# Patient Record
Sex: Male | Born: 1970 | Race: White | Hispanic: No | Marital: Married | Smoking: Never smoker
Health system: Southern US, Community
[De-identification: ages and names within clinical notes are randomized; demographics above are authoritative.]

## PROBLEM LIST (undated history)

## (undated) DIAGNOSIS — F329 Major depressive disorder, single episode, unspecified: Secondary | ICD-10-CM

## (undated) DIAGNOSIS — N200 Calculus of kidney: Secondary | ICD-10-CM

## (undated) DIAGNOSIS — F32A Depression, unspecified: Secondary | ICD-10-CM

## (undated) DIAGNOSIS — F319 Bipolar disorder, unspecified: Secondary | ICD-10-CM

## (undated) DIAGNOSIS — F419 Anxiety disorder, unspecified: Secondary | ICD-10-CM

## (undated) HISTORY — DX: Depression, unspecified: F32.A

## (undated) HISTORY — DX: Calculus of kidney: N20.0

## (undated) HISTORY — DX: Major depressive disorder, single episode, unspecified: F32.9

## (undated) HISTORY — DX: Anxiety disorder, unspecified: F41.9

## (undated) HISTORY — PX: OTHER SURGICAL HISTORY: SHX169

## (undated) HISTORY — DX: Bipolar disorder, unspecified: F31.9

---

## 2007-04-20 ENCOUNTER — Ambulatory Visit: Payer: Self-pay | Admitting: Internal Medicine

## 2011-09-03 ENCOUNTER — Ambulatory Visit: Payer: Self-pay | Admitting: Internal Medicine

## 2011-09-03 LAB — CBC CANCER CENTER
Basophil #: 0 x10 3/mm (ref 0.0–0.1)
Basophil %: 0.2 %
Eosinophil #: 0.1 x10 3/mm (ref 0.0–0.7)
Eosinophil %: 1.6 %
Eosinophil: 2 %
HCT: 45.2 % (ref 40.0–52.0)
HGB: 15.6 g/dL (ref 13.0–18.0)
Lymphocyte %: 32.4 %
Lymphocytes: 32 %
MCV: 93 fL (ref 80–100)
Metamyelocyte: 1 %
Monocyte %: 7.2 %
Monocytes: 4 %
Neutrophil #: 3.8 x10 3/mm (ref 1.4–6.5)
Neutrophil %: 58.6 %
Platelet: 185 x10 3/mm (ref 150–440)
RBC: 4.89 10*6/uL (ref 4.40–5.90)
RDW: 12.6 % (ref 11.5–14.5)
Segmented Neutrophils: 60 %
WBC: 6.5 x10 3/mm (ref 3.8–10.6)

## 2011-09-03 LAB — HEPATIC FUNCTION PANEL A (ARMC)
Albumin: 4.3 g/dL (ref 3.4–5.0)
Bilirubin, Direct: 0.2 mg/dL (ref 0.00–0.20)
Bilirubin,Total: 1.2 mg/dL — ABNORMAL HIGH (ref 0.2–1.0)
SGPT (ALT): 55 U/L (ref 12–78)
Total Protein: 7.5 g/dL (ref 6.4–8.2)

## 2011-09-03 LAB — CREATININE, SERUM: EGFR (Non-African Amer.): >60

## 2011-09-03 LAB — LACTATE DEHYDROGENASE: LDH: 190 U/L (ref 81–234)

## 2011-09-06 LAB — PROT IMMUNOELECTROPHORES(ARMC)

## 2011-09-26 ENCOUNTER — Ambulatory Visit: Payer: Self-pay | Admitting: Internal Medicine

## 2011-09-30 ENCOUNTER — Other Ambulatory Visit: Payer: Self-pay | Admitting: Gastroenterology

## 2012-05-08 ENCOUNTER — Ambulatory Visit: Payer: Self-pay | Admitting: Internal Medicine

## 2014-09-05 ENCOUNTER — Ambulatory Visit (INDEPENDENT_AMBULATORY_CARE_PROVIDER_SITE_OTHER): Payer: BC Managed Care – PPO | Admitting: Psychiatry

## 2014-09-05 ENCOUNTER — Encounter: Payer: Self-pay | Admitting: Psychiatry

## 2014-09-05 DIAGNOSIS — F3160 Bipolar disorder, current episode mixed, unspecified: Secondary | ICD-10-CM | POA: Diagnosis not present

## 2014-09-05 MED ORDER — LAMOTRIGINE 200 MG PO TABS: 200.0000 mg | ORAL_TABLET | Freq: Every morning | ORAL | Status: DC

## 2014-09-05 NOTE — Progress Notes (Signed)
BH MD/PA/NP OP Progress Note  09/05/2014 2:42 PM Jeffrey Hopkins  MRN:  [id number]  Subjective:   Patient is a 44 year old male who presented for the follow-up appointment. He reported that he has been feeling well and has been enjoying his summer break. He went with his daughters to [place] and [place] and enjoyed his time. He is now working in his house and trying to fix the bathrooms. Patient reported that he has been stable on the current dose of lamotrigine at 200 mg. He is not taking trazodone as he has been sleeping well. He reported that the Klonopin is helping him and he has been taking it on a when necessary basis. Patient also takes Claritin and vitamin B12 on a regular basis. He reported that he does not have any allergies at this time. He currently denied having any mood swings anger anxiety or paranoia. He denied having any perceptual disturbances. He appeared pleasant and cooperative during the interview.   Chief Complaint:  Chief Complaint    Follow-up; Depression     Visit Diagnosis:     ICD-9-CM ICD-10-CM   1. Bipolar affective disorder, current episode mixed, current episode severity unspecified 296.60 F31.60     Past Medical History: History reviewed. No pertinent past medical history. History reviewed. No pertinent past surgical history. Family History:  Family History  Problem Relation Age of Onset  . Anxiety disorder Father   . Bipolar disorder Maternal Uncle    Social History:  Social History   Social History  . Marital Status: Married    Spouse Name: N/A  . Number of Children: N/A  . Years of Education: N/A   Social History Main Topics  . Smoking status: Never Smoker   . Smokeless tobacco: None  . Alcohol Use: 0.6 oz/week    1 Cans of beer per week     Comment: 2-3 times per week   . Drug Use: No  . Sexual Activity: Yes   Other Topics Concern  . None   Social History Narrative  . 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  General Appearance: Casual  Eye Contact:  Fair  Speech:  Clear and Coherent and Normal Rate  Volume:  Normal  Mood:  Euthymic  Affect:  Congruent  Thought Process:  Coherent  Orientation:  Full (Time, Place, and Person)  Thought Content:  WDL  Suicidal Thoughts:  No  Homicidal Thoughts:  No  Memory:  Immediate;   Fair  Judgement:  Fair  Insight:  Fair  Psychomotor Activity:  Normal  Concentration:  Fair  Recall:  [employer] of Knowledge: Fair  Language: Fair  Akathisia:  No  Handed:  Right  AIMS (if indicated):    Assets:  Communication Skills Desire for Improvement Physical Health Social Support Talents/Skills  ADL's:  Intact  Cognition: WNL  Sleep:     Is the patient at risk to self?  No. Has the patient been a risk to self in the past 6 months?  No. Has the patient been a risk to self within the distant past?  No. Is the patient a risk  to others?  No. Has the patient been a risk to others in the past 6 months?  No. Has the patient been a risk to others within the distant past?  No.  Current Medications: Current Outpatient Prescriptions  Medication Sig Dispense Refill  . clonazePAM (KLONOPIN) 0.5 MG tablet Take 0.5 mg by mouth at bedtime.  0  . cyanocobalamin (V-R VITAMIN B-12) 500 MCG tablet Take 1,000 mcg by mouth.    . lamoTRIgine (LAMICTAL) 200 MG tablet Take 200 mg by mouth every morning.  0   No current facility-administered medications for this visit.    Medical Decision Making:  Review of Psycho-Social Stressors (1) and Review of Last Therapy Session (1)  Treatment Plan Summary:Medication management  Patient has enough supply of  of  Klonopin. I will refill his lamotrigine. He will follow up in 3 months or earlier.   More than 50% of the time spent in psychoeducation, counseling and coordination of care.    This note was generated in part or whole with voice recognition software. Voice regonition is usually quite accurate but there are transcription errors that can and very often do occur. I apologize for any typographical errors that were not detected and corrected.    Brandy Hale 09/05/2014, 2:42 PM

## 2014-12-03 ENCOUNTER — Encounter: Payer: Self-pay | Admitting: Psychiatry

## 2014-12-03 ENCOUNTER — Ambulatory Visit (INDEPENDENT_AMBULATORY_CARE_PROVIDER_SITE_OTHER): Payer: BC Managed Care – PPO | Admitting: Psychiatry

## 2014-12-03 VITALS — BP 124/80 | HR 75 | Temp 97.6°F | Ht 74.0 in | Wt 214.6 lb

## 2014-12-03 DIAGNOSIS — F3160 Bipolar disorder, current episode mixed, unspecified: Secondary | ICD-10-CM

## 2014-12-03 DIAGNOSIS — J329 Chronic sinusitis, unspecified: Secondary | ICD-10-CM | POA: Insufficient documentation

## 2014-12-03 DIAGNOSIS — E538 Deficiency of other specified B group vitamins: Secondary | ICD-10-CM | POA: Insufficient documentation

## 2014-12-03 DIAGNOSIS — R768 Other specified abnormal immunological findings in serum: Secondary | ICD-10-CM | POA: Insufficient documentation

## 2014-12-03 DIAGNOSIS — F329 Major depressive disorder, single episode, unspecified: Secondary | ICD-10-CM | POA: Insufficient documentation

## 2014-12-03 DIAGNOSIS — F32A Depression, unspecified: Secondary | ICD-10-CM | POA: Insufficient documentation

## 2014-12-03 MED ORDER — CLONAZEPAM 0.5 MG PO TABS: 0.5000 mg | ORAL_TABLET | Freq: Every day | ORAL | Status: DC

## 2014-12-03 MED ORDER — LAMOTRIGINE 200 MG PO TABS: 200.0000 mg | ORAL_TABLET | Freq: Every morning | ORAL | Status: DC

## 2014-12-03 NOTE — Progress Notes (Signed)
BH MD/PA/NP OP Progress Note  12/03/2014 10:49 AM Navi Erber  MRN:  [id number]  Subjective:   Patient is a 44 year old male who presented for the follow-up appointment. He reported that he has been feeling well and has been enjoying his day off due to the election day. He reported that he slept in late this morning. 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 He reported that he is sleeping well without the Klonopin as he has adjusted his bedroom and his wife is also using the CPAP machine. He reported that he does not have any acute symptoms at this time and his mood is stable with the help of the lamotrigine. Patient currently denied having any mood symptoms, anxiety or paranoia. He appeared calm and was pleasant during the interview. He stated that he is planning to go to the beach for the Thanksgiving with his family. He will stay home during the Christmas break. Patient reported that the medications are helping him and he has been compliant with the medications.  He denied any adverse effects to the medications.  Chief Complaint:  Chief Complaint    Follow-up; Medication Refill     Visit Diagnosis:     ICD-9-CM ICD-10-CM   1. Bipolar I disorder, most recent episode mixed (HCC) 296.60 F31.60     Past Medical History:  Past Medical History  Diagnosis Date  . Bipolar disorder (HCC)    History reviewed. No pertinent past surgical history. Family History:  Family History  Problem Relation Age of Onset  . Anxiety disorder Father   . Bipolar disorder Maternal Uncle   . Skin cancer Mother   . Allergies Sister    Social History:  Social History   Social History  . Marital Status: Married    Spouse Name: N/A  . Number of Children: N/A  . Years of Education: N/A   Social History Main Topics  . Smoking status: Never Smoker   . Smokeless tobacco: Never Used  . Alcohol Use: 3.6 oz/week    0 Glasses of wine, 6  Cans of beer, 0 Shots of liquor per week     Comment: 2-3 times per week   . Drug Use: No  . Sexual Activity: Yes    Birth Control/ Protection: None   Other Topics Concern  . 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  General Appearance: Casual  Eye Contact:  Fair  Speech:  Clear and Coherent and Normal Rate  Volume:  Normal  Mood:  Euthymic  Affect:  Congruent  Thought Process:  Coherent  Orientation:  Full (Time, Place, and Person)  Thought Content:  WDL  Suicidal Thoughts:  No  Homicidal Thoughts:  No  Memory:  Immediate;   Fair  Judgement:  Fair  Insight:  Fair  Psychomotor Activity:  Normal  Concentration:  Fair  Recall:  [employer] of Knowledge: Fair  Language: Fair  Akathisia:  No  Handed:  Right  AIMS (if indicated):    Assets:  Communication Skills Desire for Improvement Physical Health Social Support Talents/Skills  ADL's:  Intact  Cognition: WNL  Sleep:     Is the patient at risk to self?  No. Has the patient been a risk to self in the past 6 months?  No. Has the patient been a risk to self within the distant past?  No. Is the patient a risk to others?  No. Has the patient been a risk to others in the past 6 months?  No. Has the patient been a risk to others within the distant past?  No.  Current Medications: Current Outpatient Prescriptions  Medication Sig Dispense Refill  . clonazePAM (KLONOPIN) 0.5 MG tablet Take 1 tablet (0.5 mg total) by mouth at bedtime. 30 tablet 1  .  cyanocobalamin (V-R VITAMIN B-12) 500 MCG tablet Take 1,000 mcg by mouth.    . lamoTRIgine (LAMICTAL) 200 MG tablet Take 1 tablet (200 mg total) by mouth every morning. 90 tablet 2   No current facility-administered medications for this visit.    Medical Decision Making:  Review of Psycho-Social Stressors (1) and Review of Last Therapy Session (1)  Treatment Plan Summary:Medication management   Anxiety Continue Klonopin 0.5 mg by mouth daily at bedtime when necessary and medication refilled.    Mood symptoms  Patient has enough supply of the lamotrigine  He will follow up in 3 months or earlier.   More than 50% of the time spent in psychoeducation, counseling and coordination of care.    Time spent with the patient 25 minutes   This note was generated in part or whole with voice recognition software. Voice regonition is usually quite accurate but there are transcription errors that can and very often do occur. I apologize for any typographical errors that were not detected and corrected.    Brandy HaleUzma Kemper Hochman 12/03/2014, 10:49 AM

## 2015-01-28 ENCOUNTER — Ambulatory Visit: Payer: BC Managed Care – PPO | Admitting: Psychiatry

## 2015-02-10 ENCOUNTER — Emergency Department: Payer: BC Managed Care – PPO

## 2015-02-10 ENCOUNTER — Encounter: Payer: Self-pay | Admitting: *Deleted

## 2015-02-10 ENCOUNTER — Emergency Department
Admission: EM | Admit: 2015-02-10 | Discharge: 2015-02-10 | Disposition: A | Discharge status: Home / Self Care | Payer: BC Managed Care – PPO | Attending: Emergency Medicine | Admitting: Emergency Medicine

## 2015-02-10 DIAGNOSIS — N200 Calculus of kidney: Secondary | ICD-10-CM

## 2015-02-10 DIAGNOSIS — R109 Unspecified abdominal pain: Secondary | ICD-10-CM | POA: Diagnosis present

## 2015-02-10 DIAGNOSIS — Z79899 Other long term (current) drug therapy: Secondary | ICD-10-CM | POA: Insufficient documentation

## 2015-02-10 LAB — URINALYSIS COMPLETE WITH MICROSCOPIC (ARMC ONLY)
Bilirubin Urine: NEGATIVE
Glucose, UA: NEGATIVE mg/dL
KETONES UR: NEGATIVE mg/dL
Leukocytes, UA: NEGATIVE
Nitrite: NEGATIVE
PH: 6 (ref 5.0–8.0)
PROTEIN: NEGATIVE mg/dL
Specific Gravity, Urine: 1.009 (ref 1.005–1.030)

## 2015-02-10 LAB — COMPREHENSIVE METABOLIC PANEL
ALBUMIN: 4.6 g/dL (ref 3.5–5.0)
ALT: 54 U/L (ref 17–63)
AST: 39 U/L (ref 15–41)
Alkaline Phosphatase: 71 U/L (ref 38–126)
Anion gap: 10 (ref 5–15)
BILIRUBIN TOTAL: 0.9 mg/dL (ref 0.3–1.2)
BUN: 11 mg/dL (ref 6–20)
CO2: 25 mmol/L (ref 22–32)
CREATININE: 1.13 mg/dL (ref 0.61–1.24)
Calcium: 9.5 mg/dL (ref 8.9–10.3)
Chloride: 106 mmol/L (ref 101–111)
GFR calc Af Amer: >60 mL/min (ref >60)
GLUCOSE: 112 mg/dL — AB (ref 65–99)
POTASSIUM: 3.9 mmol/L (ref 3.5–5.1)
Sodium: 141 mmol/L (ref 135–145)
TOTAL PROTEIN: 7.1 g/dL (ref 6.5–8.1)

## 2015-02-10 LAB — CBC WITH DIFFERENTIAL/PLATELET
BASOS ABS: 0 10*3/uL (ref 0–0.1)
BASOS PCT: 0 %
Eosinophils Absolute: 0 10*3/uL (ref 0–0.7)
Eosinophils Relative: 0 %
HEMATOCRIT: 42.8 % (ref 40.0–52.0)
HEMOGLOBIN: 14.7 g/dL (ref 13.0–18.0)
LYMPHS PCT: 10 %
Lymphs Abs: 1 10*3/uL (ref 1.0–3.6)
MCH: 30.7 pg (ref 26.0–34.0)
MCHC: 34.2 g/dL (ref 32.0–36.0)
MCV: 89.6 fL (ref 80.0–100.0)
MONO ABS: 0.7 10*3/uL (ref 0.2–1.0)
Monocytes Relative: 7 %
NEUTROS ABS: 8.5 10*3/uL — AB (ref 1.4–6.5)
NEUTROS PCT: 83 %
Platelets: 175 10*3/uL (ref 150–440)
RBC: 4.78 MIL/uL (ref 4.40–5.90)
RDW: 12.6 % (ref 11.5–14.5)
WBC: 10.2 10*3/uL (ref 3.8–10.6)

## 2015-02-10 MED ORDER — FENTANYL CITRATE (PF) 100 MCG/2ML IJ SOLN
50.0000 ug | Freq: Once | INTRAMUSCULAR | Status: AC
  Administered 2015-02-10: 50 ug via INTRAVENOUS

## 2015-02-10 MED ORDER — FENTANYL CITRATE (PF) 100 MCG/2ML IJ SOLN
INTRAMUSCULAR | Status: AC
  Filled 2015-02-10: qty 2

## 2015-02-10 MED ORDER — TAMSULOSIN HCL 0.4 MG PO CAPS: 0.4000 mg | ORAL_CAPSULE | Freq: Every day | ORAL | Status: DC

## 2015-02-10 MED ORDER — TAMSULOSIN HCL 0.4 MG PO CAPS
0.4000 mg | ORAL_CAPSULE | Freq: Once | ORAL | Status: AC
  Administered 2015-02-10: 0.4 mg via ORAL
  Filled 2015-02-10: qty 1

## 2015-02-10 MED ORDER — ONDANSETRON HCL 4 MG/2ML IJ SOLN
4.0000 mg | Freq: Once | INTRAMUSCULAR | Status: AC
  Administered 2015-02-10: 4 mg via INTRAVENOUS

## 2015-02-10 MED ORDER — KETOROLAC TROMETHAMINE 30 MG/ML IJ SOLN: 30.0000 mg | Freq: Once | INTRAMUSCULAR | Status: DC

## 2015-02-10 MED ORDER — ONDANSETRON HCL 4 MG/2ML IJ SOLN
INTRAMUSCULAR | Status: AC
  Filled 2015-02-10: qty 2

## 2015-02-10 MED ORDER — OXYCODONE-ACETAMINOPHEN 5-325 MG PO TABS: 1.0000 | ORAL_TABLET | Freq: Four times a day (QID) | ORAL | Status: DC | PRN

## 2015-02-10 MED ORDER — MORPHINE SULFATE (PF) 4 MG/ML IV SOLN
4.0000 mg | Freq: Once | INTRAVENOUS | Status: AC
  Administered 2015-02-10: 4 mg via INTRAVENOUS
  Filled 2015-02-10: qty 1

## 2015-02-10 NOTE — ED Provider Notes (Signed)
[facility] Emergency Department Provider Note  ____________________________________________  Time seen: Approximately 1310 PM  I have reviewed the triage vital signs and the nursing notes.   HISTORY  Chief Complaint Flank Pain    HPI Jeffrey Hopkins is a 45 y.o. male with a history of bipolar disorder who is presenting today with sudden onset right flank pain earlier this morning. He has not been able to urinate since the onset of the pain. He describes the pain as sharp and radiating into his right groin. Had one episode of vomiting after drinking a large amount of water this morning. Not complaining of any nausea at this time. Denies any abdominal pain. No family history of kidney stones.Says that the fentanyl hadn't initially up with this pain but the pain is now resurging.     Past Medical History  Diagnosis Date  . Bipolar disorder [facility](HCC)     Patient Active Problem List   Diagnosis Date Noted  . Clinical depression 12/03/2014  . 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 Recurrent sinus infections 12/03/2014  . B12 deficiency 12/03/2014    History reviewed. No pertinent past surgical history.  Current Outpatient Rx  Name  Route  Sig  Dispense  Refill  . clonazePAM (KLONOPIN) 0.5 MG tablet   Oral   Take 1 tablet (0.5 mg total) by mouth at bedtime.   30 tablet   1   . cyanocobalamin (V-R VITAMIN B-12) 500 MCG tablet   Oral   Take 1,000 mcg by mouth.         . lamoTRIgine (LAMICTAL) 200 MG tablet   Oral   Take 1 tablet (200 mg total) by mouth every morning.   90 tablet   2     Allergies Review of patient's allergies indicates no known allergies.  Family History  Problem Relation Age of Onset  . Anxiety disorder Father   . Bipolar disorder Maternal Uncle   . Skin cancer Mother   . Allergies Sister     Social History Social History  Substance Use Topics  . Smoking status: Never Smoker   . Smokeless tobacco: Never Used  . Alcohol  Use: 3.6 oz/week    0 Glasses of wine, 6 Cans of beer, 0 Shots of liquor per week     Comment: 2-3 times per week     Review of Systems Constitutional: No fever/chills Eyes: No visual changes. ENT: No sore throat. Cardiovascular: Denies chest pain. Respiratory: Denies shortness of breath. Gastrointestinal: No abdominal pain.  No nausea, no vomiting.  No diarrhea.  No constipation. Genitourinary: Negative for dysuria. Musculoskeletal: As above Skin: Negative for rash. Neurological: Negative for headaches, focal weakness or numbness.  10-point ROS otherwise negative.  ____________________________________________   PHYSICAL EXAM:  VITAL SIGNS: ED Triage Vitals  Enc Vitals Group     BP 02/10/15 1251 124/72 mmHg     Pulse Rate 02/10/15 1251 70     Resp 02/10/15 1251 20     Temp --      Temp src --      SpO2 02/10/15 1251 99 %     Weight 02/10/15 1251 215 lb (97.523 kg)     Height 02/10/15 1251 6\' 2"  (1.88 m)     Head Cir --      Peak Flow --      Pain Score 02/10/15 1252 10     Pain Loc --      Pain Edu? --      Excl.  in GC? --     Constitutional: Alert and oriented. Well appearing and in no acute distress. Eyes: Conjunctivae are normal. PERRL. EOMI. Head: Atraumatic. Nose: No congestion/rhinnorhea. Mouth/Throat: Mucous membranes are moist.  Oropharynx non-erythematous. Neck: No stridor.   Cardiovascular: Normal rate, regular rhythm. Grossly normal heart sounds.  Good peripheral circulation. Respiratory: Normal respiratory effort.  No retractions. Lungs CTAB. Gastrointestinal: Soft and nontender. No distention. No abdominal bruits. Right sided CVA tenderness Musculoskeletal: No lower extremity tenderness nor edema.  No joint effusions. Neurologic:  Normal speech and language. No gross focal neurologic deficits are appreciated. No gait instability. Skin:  Skin is warm, dry and intact. No rash noted. Psychiatric: Mood and affect are normal. Speech and behavior are  normal.  ____________________________________________   LABS (all labs ordered are listed, but only abnormal results are displayed)  Labs Reviewed  URINALYSIS COMPLETEWITH MICROSCOPIC ([facility] ONLY) - Abnormal; Notable for the following:    Color, Urine YELLOW (*)    APPearance CLEAR (*)    Hgb urine dipstick 3+ (*)    Bacteria, UA RARE (*)    Squamous Epithelial / LPF 0-5 (*)    All other components within normal limits  CBC WITH DIFFERENTIAL/PLATELET - Abnormal; Notable for the following:    Neutro Abs 8.5 (*)    All other components within normal limits  COMPREHENSIVE METABOLIC PANEL - Abnormal; Notable for the following:    Glucose, Bld 112 (*)    All other components within normal limits   ____________________________________________  EKG   ____________________________________________  RADIOLOGY  Obstructing 5 x 3 mm stone in the proximal right ureter near the UPJ with mild right hydronephrosis. ____________________________________________   PROCEDURES    ____________________________________________   INITIAL IMPRESSION / ASSESSMENT AND PLAN / ED COURSE  Pertinent labs & imaging results that were available during my care of the patient were reviewed by me and considered in my medical decision making (see chart for details).  ----------------------------------------- 3:38 PM on 02/10/2015 -----------------------------------------  Patient's pain is completely relieved at this time after dose of morphine. No nausea or vomiting. Patient says he would like to eat. Discussed the case with Dr. Kathrynn Running of urology because of the proximal nature of the stone as well as the size. Dr. Kathrynn Running says that the patient may have a trial of passage. We'll give dose of Flomax here in the emergency department and discharged with Flomax, Percocet and a strainer. Explained this plan to the patient as well as his family was at the bedside and they understand and are willing to comply.  Discussed return precautions, especially increasing pain, nausea vomiting and fever. The patient knows to return immediately if any new symptoms occur or he has any worsening or concerning symptoms. ____________________________________________   FINAL CLINICAL IMPRESSION(S) / ED DIAGNOSES  Final diagnoses:  Right flank pain   kidney stone.    Myrna Blazer, MD 02/10/15 1539

## 2015-02-10 NOTE — Discharge Instructions (Signed)
Kidney Stones °Kidney stones (urolithiasis) are deposits that form inside your kidneys. The intense pain is caused by the stone moving through the urinary tract. When the stone moves, the ureter goes into spasm around the stone. The stone is usually passed in the urine.  °CAUSES  °· A disorder that makes certain neck glands produce too much parathyroid hormone (primary hyperparathyroidism). °· A buildup of uric acid crystals, similar to gout in your joints. °· Narrowing (stricture) of the ureter. °· A kidney obstruction present at birth (congenital obstruction). °· Previous surgery on the kidney or ureters. °· Numerous kidney infections. °SYMPTOMS  °· Feeling sick to your stomach (nauseous). °· Throwing up (vomiting). °· Blood in the urine (hematuria). °· Pain that usually spreads (radiates) to the groin. °· Frequency or urgency of urination. °DIAGNOSIS  °· Taking a history and physical exam. °· Blood or urine tests. °· CT scan. °· Occasionally, an examination of the inside of the urinary bladder (cystoscopy) is performed. °TREATMENT  °· Observation. °· Increasing your fluid intake. °· Extracorporeal shock wave lithotripsy--This is a noninvasive procedure that uses shock waves to break up kidney stones. °· Surgery may be needed if you have severe pain or persistent obstruction. There are various surgical procedures. Most of the procedures are performed with the use of small instruments. Only small incisions are needed to accommodate these instruments, so recovery time is minimized. °The size, location, and chemical composition are all important variables that will determine the proper choice of action for you. Talk to your health care provider to better understand your situation so that you will minimize the risk of injury to yourself and your kidney.  °HOME CARE INSTRUCTIONS  °· Drink enough water and fluids to keep your urine clear or pale yellow. This will help you to pass the stone or stone fragments. °· Strain  all urine through the provided strainer. Keep all particulate matter and stones for your health care provider to see. The stone causing the pain may be as small as a grain of salt. It is very important to use the strainer each and every time you pass your urine. The collection of your stone will allow your health care provider to analyze it and verify that a stone has actually passed. The stone analysis will often identify what you can do to reduce the incidence of recurrences. °· Only take over-the-counter or prescription medicines for pain, discomfort, or fever as directed by your health care provider. °· Keep all follow-up visits as told by your health care provider. This is important. °· Get follow-up X-rays if required. The absence of pain does not always mean that the stone has passed. It may have only stopped moving. If the urine remains completely obstructed, it can cause loss of kidney function or even complete destruction of the kidney. It is your responsibility to make sure X-rays and follow-ups are completed. Ultrasounds of the kidney can show blockages and the status of the kidney. Ultrasounds are not associated with any radiation and can be performed easily in a matter of minutes. °· Make changes to your daily diet as told by your health care provider. You may be told to: °¨ Limit the amount of salt that you eat. °¨ Eat 5 or more servings of fruits and vegetables each day. °¨ Limit the amount of meat, poultry, fish, and eggs that you eat. °· Collect a 24-hour urine sample as told by your health care provider. You may need to collect another urine sample every 6-12   months. °SEEK MEDICAL CARE IF: °· You experience pain that is progressive and unresponsive to any pain medicine you have been prescribed. °SEEK IMMEDIATE MEDICAL CARE IF:  °· Pain cannot be controlled with the prescribed medicine. °· You have a fever or shaking chills. °· The severity or intensity of pain increases over 18 hours and is not  relieved by pain medicine. °· You develop a new onset of abdominal pain. °· You feel faint or pass out. °· You are unable to urinate. °  °This information is not intended to replace advice given to you by your health care provider. Make sure you discuss any questions you have with your health care provider. °  °Document Released: 01/11/2005 Document Revised: 10/02/2014 Document Reviewed: 06/14/2012 °[employer] Interactive Patient Education ©2016 [employer] ° °

## 2015-02-11 ENCOUNTER — Encounter: Payer: Self-pay | Admitting: *Deleted

## 2015-02-11 ENCOUNTER — Emergency Department
Admission: EM | Admit: 2015-02-11 | Discharge: 2015-02-11 | Disposition: A | Discharge status: Home / Self Care | Payer: BC Managed Care – PPO | Attending: Emergency Medicine | Admitting: Emergency Medicine

## 2015-02-11 DIAGNOSIS — N23 Unspecified renal colic: Secondary | ICD-10-CM | POA: Insufficient documentation

## 2015-02-11 DIAGNOSIS — R109 Unspecified abdominal pain: Secondary | ICD-10-CM | POA: Diagnosis present

## 2015-02-11 DIAGNOSIS — Z79899 Other long term (current) drug therapy: Secondary | ICD-10-CM | POA: Diagnosis not present

## 2015-02-11 DIAGNOSIS — N200 Calculus of kidney: Secondary | ICD-10-CM | POA: Diagnosis not present

## 2015-02-11 LAB — CBC
HEMATOCRIT: 42.1 % (ref 40.0–52.0)
HEMOGLOBIN: 14.3 g/dL (ref 13.0–18.0)
MCH: 30.6 pg (ref 26.0–34.0)
MCHC: 33.9 g/dL (ref 32.0–36.0)
MCV: 90.4 fL (ref 80.0–100.0)
Platelets: 175 10*3/uL (ref 150–440)
RBC: 4.66 MIL/uL (ref 4.40–5.90)
RDW: 12.5 % (ref 11.5–14.5)
WBC: 6.6 10*3/uL (ref 3.8–10.6)

## 2015-02-11 LAB — BASIC METABOLIC PANEL
Anion gap: 8 (ref 5–15)
BUN: 10 mg/dL (ref 6–20)
CHLORIDE: 106 mmol/L (ref 101–111)
CO2: 25 mmol/L (ref 22–32)
Calcium: 9.4 mg/dL (ref 8.9–10.3)
Creatinine, Ser: 1.06 mg/dL (ref 0.61–1.24)
GFR calc non Af Amer: >60 mL/min (ref >60)
Glucose, Bld: 114 mg/dL — ABNORMAL HIGH (ref 65–99)
POTASSIUM: 4.3 mmol/L (ref 3.5–5.1)
SODIUM: 139 mmol/L (ref 135–145)

## 2015-02-11 LAB — URINALYSIS COMPLETE WITH MICROSCOPIC (ARMC ONLY)
BILIRUBIN URINE: NEGATIVE
Bacteria, UA: NONE SEEN
Glucose, UA: NEGATIVE mg/dL
Ketones, ur: NEGATIVE mg/dL
Leukocytes, UA: NEGATIVE
Nitrite: NEGATIVE
PH: 6 (ref 5.0–8.0)
PROTEIN: NEGATIVE mg/dL
Specific Gravity, Urine: 1.018 (ref 1.005–1.030)

## 2015-02-11 MED ORDER — FENTANYL CITRATE (PF) 100 MCG/2ML IJ SOLN
100.0000 ug | Freq: Once | INTRAMUSCULAR | Status: AC
  Administered 2015-02-11: 100 ug via INTRAVENOUS
  Filled 2015-02-11: qty 2

## 2015-02-11 MED ORDER — HYDROMORPHONE HCL 1 MG/ML IJ SOLN: 1.0000 mg | Freq: Once | INTRAMUSCULAR | Status: DC

## 2015-02-11 MED ORDER — ONDANSETRON HCL 4 MG/2ML IJ SOLN
4.0000 mg | Freq: Once | INTRAMUSCULAR | Status: AC
  Administered 2015-02-11: 4 mg via INTRAVENOUS
  Filled 2015-02-11: qty 2

## 2015-02-11 MED ORDER — SODIUM CHLORIDE 0.9 % IV BOLUS (SEPSIS)
1000.0000 mL | Freq: Once | INTRAVENOUS | Status: AC
  Administered 2015-02-11: 1000 mL via INTRAVENOUS

## 2015-02-11 MED ORDER — LORAZEPAM 2 MG/ML IJ SOLN
1.0000 mg | Freq: Once | INTRAMUSCULAR | Status: AC
  Administered 2015-02-11: 1 mg via INTRAVENOUS
  Filled 2015-02-11: qty 1

## 2015-02-11 MED ORDER — ONDANSETRON HCL 4 MG PO TABS: 4.0000 mg | ORAL_TABLET | Freq: Three times a day (TID) | ORAL | Status: DC | PRN

## 2015-02-11 MED ORDER — IBUPROFEN 800 MG PO TABS: 800.0000 mg | ORAL_TABLET | Freq: Three times a day (TID) | ORAL | Status: DC | PRN

## 2015-02-11 MED ORDER — HYDROMORPHONE HCL 2 MG PO TABS: 2.0000 mg | ORAL_TABLET | Freq: Two times a day (BID) | ORAL | Status: DC | PRN

## 2015-02-11 MED ORDER — MORPHINE SULFATE (PF) 4 MG/ML IV SOLN
4.0000 mg | Freq: Once | INTRAVENOUS | Status: AC
  Administered 2015-02-11: 4 mg via INTRAVENOUS
  Filled 2015-02-11: qty 1

## 2015-02-11 MED ORDER — KETOROLAC TROMETHAMINE 30 MG/ML IJ SOLN
30.0000 mg | Freq: Once | INTRAMUSCULAR | Status: AC
  Administered 2015-02-11: 30 mg via INTRAVENOUS
  Filled 2015-02-11: qty 1

## 2015-02-11 NOTE — ED Provider Notes (Signed)
Time Seen: Approximately 1240 I have reviewed the triage notes  Chief Complaint: Flank Pain   History of Present Illness: Jeffrey Hopkins is a 45 y.o. male who presents with history of kidney stone. Patient was here and evaluated yesterday and was found to have a kidney stone approximately 5 mm in size. Prescribed Percocet at home and states he has tried to take the Percocet pills had persistent nausea and vomiting. He denies any fever or obvious hematuria. He denies any change in the characteristics, location and intensity of the pain. Patient denies any testicular pain or masses  Past Medical History  Diagnosis Date  . Bipolar disorder [facility])     Patient Active Problem List   Diagnosis Date Noted  . Clinical depression 12/03/2014  . 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 Recurrent sinus infections 12/03/2014  . B12 deficiency 12/03/2014    History reviewed. No pertinent past surgical history.  History reviewed. No pertinent past surgical history.  Current Outpatient Rx  Name  Route  Sig  Dispense  Refill  . clonazePAM (KLONOPIN) 0.5 MG tablet   Oral   Take 1 tablet (0.5 mg total) by mouth at bedtime.   30 tablet   1   . cyanocobalamin (V-R VITAMIN B-12) 500 MCG tablet   Oral   Take 1,000 mcg by mouth.         . lamoTRIgine (LAMICTAL) 200 MG tablet   Oral   Take 1 tablet (200 mg total) by mouth every morning.   90 tablet   2   . oxyCODONE-acetaminophen (ROXICET) 5-325 MG tablet   Oral   Take 1-2 tablets by mouth every 6 (six) hours as needed.   15 tablet   0   . tamsulosin (FLOMAX) 0.4 MG CAPS capsule   Oral   Take 1 capsule (0.4 mg total) by mouth daily.   30 capsule   0     Allergies:  Review of patient's allergies indicates no known allergies.  Family History: Family History  Problem Relation Age of Onset  . Anxiety disorder Father   . Bipolar disorder Maternal Uncle   . Skin cancer Mother   . Allergies Sister     Social History: Social  History  Substance Use Topics  . Smoking status: Never Smoker   . Smokeless tobacco: Never Used  . Alcohol Use: 3.6 oz/week    0 Glasses of wine, 6 Cans of beer, 0 Shots of liquor per week     Comment: 2-3 times per week      Review of Systems:   10 point review of systems was performed and was otherwise negative:  Constitutional: No fever Eyes: No visual disturbances ENT: No sore throat, ear pain Cardiac: No chest pain Respiratory: No shortness of breath, wheezing, or stridor Abdomen: No abdominal pain, no vomiting, No diarrhea Endocrine: No weight loss, No night sweats Extremities: No peripheral edema, cyanosis Skin: No rashes, easy bruising Neurologic: No focal weakness, trouble with speech or swollowing Urologic: No dysuria, Hematuria, or urinary frequency   Physical Exam:  ED Triage Vitals  Enc Vitals Group     BP 02/11/15 1217 140/87 mmHg     Pulse Rate 02/11/15 1217 76     Resp 02/11/15 1217 20     Temp 02/11/15 1217 97.5 F (36.4 C)     Temp Source 02/11/15 1217 Oral     SpO2 02/11/15 1217 100 %     Weight --      Height --  Head Cir --      Peak Flow --      Pain Score 02/11/15 1219 9     Pain Loc --      Pain Edu? --      Excl. in GC? --     General: Awake , Alert , and Oriented times 3; GCS 15 Head: Normal cephalic , atraumatic Eyes: Pupils equal , round, reactive to light Nose/Throat: No nasal drainage, patent upper airway without erythema or exudate.  Neck: Supple, Full range of motion, No anterior adenopathy or palpable thyroid masses Lungs: Clear to ascultation without wheezes , rhonchi, or rales Heart: Regular rate, regular rhythm without murmurs , gallops , or rubs Abdomen: Soft, non tender without rebound, guarding , or rigidity; bowel sounds positive and symmetric in all 4 quadrants. No organomegaly .        Extremities: 2 plus symmetric pulses. No edema, clubbing or cyanosis Neurologic: normal ambulation, Motor symmetric without  deficits, sensory intact Skin: warm, dry, no rashes   Labs:   All laboratory work was reviewed including any pertinent negatives or positives listed below:  Labs Reviewed  URINALYSIS COMPLETEWITH MICROSCOPIC ([facility] ONLY)  CBC  BASIC METABOLIC PANEL   review laboratory work was within normal limits     ED Course:  The patient received some IV fentanyl, IV Toradol and some IV morphine with symptomatic improvement. He also had resolution of his nausea and vomiting with some IV Zofran. Patient has a follow-up appointment scheduled already with urology tomorrow. I felt given the size and location of her dystonia likely proceed normally and likely urinate the stone himself. His urine does not show any signs of infection. He was advised continue with the Flomax and was prescribed ibuprofen, Dilaudid, and Zofran on an outpatient basis. She is and concerns were addressed at bedside the patient was discharged in stable improved condition    Assessment:  Renal colic   Final Clinical Impression:   Final diagnoses:  Flank pain     Plan: Outpatient management Patient was advised to return immediately if condition worsens. Patient was advised to follow up with their primary care physician or other specialized physicians involved in their outpatient care             Jennye Moccasin, MD 02/11/15 1611

## 2015-02-11 NOTE — Discharge Instructions (Signed)
Kidney Stones °Kidney stones (urolithiasis) are deposits that form inside your kidneys. The intense pain is caused by the stone moving through the urinary tract. When the stone moves, the ureter goes into spasm around the stone. The stone is usually passed in the urine.  °CAUSES  °· A disorder that makes certain neck glands produce too much parathyroid hormone (primary hyperparathyroidism). °· A buildup of uric acid crystals, similar to gout in your joints. °· Narrowing (stricture) of the ureter. °· A kidney obstruction present at birth (congenital obstruction). °· Previous surgery on the kidney or ureters. °· Numerous kidney infections. °SYMPTOMS  °· Feeling sick to your stomach (nauseous). °· Throwing up (vomiting). °· Blood in the urine (hematuria). °· Pain that usually spreads (radiates) to the groin. °· Frequency or urgency of urination. °DIAGNOSIS  °· Taking a history and physical exam. °· Blood or urine tests. °· CT scan. °· Occasionally, an examination of the inside of the urinary bladder (cystoscopy) is performed. °TREATMENT  °· Observation. °· Increasing your fluid intake. °· Extracorporeal shock wave lithotripsy--This is a noninvasive procedure that uses shock waves to break up kidney stones. °· Surgery may be needed if you have severe pain or persistent obstruction. There are various surgical procedures. Most of the procedures are performed with the use of small instruments. Only small incisions are needed to accommodate these instruments, so recovery time is minimized. °The size, location, and chemical composition are all important variables that will determine the proper choice of action for you. Talk to your health care provider to better understand your situation so that you will minimize the risk of injury to yourself and your kidney.  °HOME CARE INSTRUCTIONS  °· Drink enough water and fluids to keep your urine clear or pale yellow. This will help you to pass the stone or stone fragments. °· Strain  all urine through the provided strainer. Keep all particulate matter and stones for your health care provider to see. The stone causing the pain may be as small as a grain of salt. It is very important to use the strainer each and every time you pass your urine. The collection of your stone will allow your health care provider to analyze it and verify that a stone has actually passed. The stone analysis will often identify what you can do to reduce the incidence of recurrences. °· Only take over-the-counter or prescription medicines for pain, discomfort, or fever as directed by your health care provider. °· Keep all follow-up visits as told by your health care provider. This is important. °· Get follow-up X-rays if required. The absence of pain does not always mean that the stone has passed. It may have only stopped moving. If the urine remains completely obstructed, it can cause loss of kidney function or even complete destruction of the kidney. It is your responsibility to make sure X-rays and follow-ups are completed. Ultrasounds of the kidney can show blockages and the status of the kidney. Ultrasounds are not associated with any radiation and can be performed easily in a matter of minutes. °· Make changes to your daily diet as told by your health care provider. You may be told to: °¨ Limit the amount of salt that you eat. °¨ Eat 5 or more servings of fruits and vegetables each day. °¨ Limit the amount of meat, poultry, fish, and eggs that you eat. °· Collect a 24-hour urine sample as told by your health care provider. You may need to collect another urine sample every 6-12   months. °SEEK MEDICAL CARE IF: °· You experience pain that is progressive and unresponsive to any pain medicine you have been prescribed. °SEEK IMMEDIATE MEDICAL CARE IF:  °· Pain cannot be controlled with the prescribed medicine. °· You have a fever or shaking chills. °· The severity or intensity of pain increases over 18 hours and is not  relieved by pain medicine. °· You develop a new onset of abdominal pain. °· You feel faint or pass out. °· You are unable to urinate. °  °This information is not intended to replace advice given to you by your health care provider. Make sure you discuss any questions you have with your health care provider. °  °Document Released: 01/11/2005 Document Revised: 10/02/2014 Document Reviewed: 06/14/2012 °Elsevier Interactive Patient Education ©2016 [employer] ° °Please return immediately if condition worsens. Please contact her primary physician or the physician you were given for referral. If you have any specialist physicians involved in her treatment and plan please also contact them. Thank you for using [facility]. ° °

## 2015-02-11 NOTE — ED Notes (Signed)
Pt was seen in ER yesterday for right flank pain/kidney stone, pt reports increased pain uncontrolled with pain medication and vomiting

## 2015-02-12 ENCOUNTER — Ambulatory Visit (INDEPENDENT_AMBULATORY_CARE_PROVIDER_SITE_OTHER): Payer: BC Managed Care – PPO | Admitting: Urology

## 2015-02-12 ENCOUNTER — Encounter: Payer: Self-pay | Admitting: Urology

## 2015-02-12 ENCOUNTER — Ambulatory Visit
Admission: RE | Admit: 2015-02-12 | Discharge: 2015-02-12 | Disposition: A | Discharge status: Home / Self Care | Payer: BC Managed Care – PPO | Source: Ambulatory Visit | Attending: Urology | Admitting: Urology

## 2015-02-12 VITALS — BP 125/81 | HR 73 | Ht 74.0 in | Wt 223.8 lb

## 2015-02-12 DIAGNOSIS — N201 Calculus of ureter: Secondary | ICD-10-CM | POA: Insufficient documentation

## 2015-02-12 DIAGNOSIS — R3129 Other microscopic hematuria: Secondary | ICD-10-CM

## 2015-02-12 DIAGNOSIS — N1330 Unspecified hydronephrosis: Secondary | ICD-10-CM | POA: Diagnosis not present

## 2015-02-12 LAB — URINALYSIS, COMPLETE
Bilirubin, UA: NEGATIVE
GLUCOSE, UA: NEGATIVE
Ketones, UA: NEGATIVE
Leukocytes, UA: NEGATIVE
NITRITE UA: NEGATIVE
Protein, UA: NEGATIVE
SPEC GRAV UA: 1.015 (ref 1.005–1.030)
UUROB: 0.2 mg/dL (ref 0.2–1.0)
pH, UA: 6 (ref 5.0–7.5)

## 2015-02-12 LAB — MICROSCOPIC EXAMINATION
Epithelial Cells (non renal): NONE SEEN /hpf (ref 0–10)
RBC, UA: >30 /hpf — ABNORMAL HIGH (ref 0–?)

## 2015-02-12 MED ORDER — ONDANSETRON HCL 4 MG PO TABS
4.0000 mg | ORAL_TABLET | Freq: Three times a day (TID) | ORAL | Status: DC | PRN
Start: 2015-02-12 — End: 2015-08-12

## 2015-02-12 MED ORDER — HYDROMORPHONE HCL 2 MG PO TABS: 2.0000 mg | ORAL_TABLET | ORAL | Status: DC | PRN

## 2015-02-12 NOTE — Progress Notes (Signed)
02/12/2015 3:58 PM   Guy Begin [birth year] [id number]  Referring provider: Lynnea Ferrier, MD [address] [facility] [place], [place] [address]  Chief Complaint  Patient presents with  . Nephrolithiasis    referred by ER    HPI: Patient is a 45 year old Caucasian male who presents today as a follow-up from the emergency room due to an ureteral stone.  Patient states that 2 weeks ago the pain initiated. At first, he thought it was muscle skeletal in nature. But then, 2 days ago he was awoken with intense right-sided flank pain associated with vomiting. He was then seen in the emergency room where urinalysis was performed and noted to numerous to count RBC's per high powered field.  CT renal stone study noted an obstructing 55 mm stone in the proximal right ureter near the UPJ with mild right hydronephrosis.  He was given oxycodone/APAP 5/325 and Flomax and discharged home.  The next day the pain returned. It was located in the right flank radiating to the right waist and again associated with vomiting. Patient paced the floor to see if he could easily pain but could not find relief. He was then seen in the emergency room and given Dilaudid 2 mg for the pain.    Since that time he has been taking Dilaudid every 4 hours. He has not passed any fragments. He is straining his urine. His UA is positive for greater than 30 RBC's per high-power field on today's exam.  The stone is not visible on today's KUB.   PMH: Past Medical History  Diagnosis Date  . Bipolar disorder (HCC)   . Anxiety   . Depression   . Kidney stone     Surgical History: Past Surgical History  Procedure Laterality Date  . None      Home Medications:    Medication List       This list is accurate as of: 02/12/15  3:58 PM.  Always use your most recent med list.               clonazePAM 0.5 MG tablet  Commonly known as:  KLONOPIN  Take 1 tablet (0.5 mg total) by mouth at bedtime.       HYDROmorphone 2 MG tablet  Commonly known as:  DILAUDID  Take 1 tablet (2 mg total) by mouth every 12 (twelve) hours as needed for severe pain.     HYDROmorphone 2 MG tablet  Commonly known as:  DILAUDID  Take 1 tablet (2 mg total) by mouth every 4 (four) hours as needed for severe pain.     ibuprofen 800 MG tablet  Commonly known as:  ADVIL,MOTRIN  Take 1 tablet (800 mg total) by mouth every 8 (eight) hours as needed.     lamoTRIgine 200 MG tablet  Commonly known as:  LAMICTAL  Take 1 tablet (200 mg total) by mouth every morning.     ondansetron 4 MG tablet  Commonly known as:  ZOFRAN  Take 1 tablet (4 mg total) by mouth every 8 (eight) hours as needed for nausea or vomiting.     oxyCODONE-acetaminophen 5-325 MG tablet  Commonly known as:  ROXICET  Take 1-2 tablets by mouth every 6 (six) hours as needed.     tamsulosin 0.4 MG Caps capsule  Commonly known as:  FLOMAX  Take 1 capsule (0.4 mg total) by mouth daily.     V-R VITAMIN B-12 500 MCG tablet  Generic drug:  cyanocobalamin  Take 1,000 mcg by mouth.        Allergies:  Allergies  Allergen Reactions  . Vicodin [Hydrocodone-Acetaminophen] Nausea And Vomiting    Family History: Family History  Problem Relation Age of Onset  . Anxiety disorder Father   . Bipolar disorder Maternal Uncle   . Skin cancer Mother   . Allergies Sister   . Kidney disease Neg Hx   . Prostate cancer Neg Hx   . Colon cancer Paternal Grandfather     Social History:  reports that he has never smoked. He has never used smokeless tobacco. He reports that he drinks about 3.6 oz of alcohol per week. He reports that he does not use illicit drugs.  ROS: UROLOGY Frequent Urination?: No Hard to postpone urination?: No Burning/pain with urination?: No Get up at night to urinate?: No Leakage of urine?: No Urine stream starts and stops?: No Trouble starting stream?: No Do you have to strain to urinate?: No Blood in urine?: Yes Urinary  tract infection?: No Sexually transmitted disease?: No Injury to kidneys or bladder?: No Painful intercourse?: No Weak stream?: No Erection problems?: No Penile pain?: No  Gastrointestinal Nausea?: Yes Vomiting?: Yes Indigestion/heartburn?: No Diarrhea?: No Constipation?: No  Constitutional Fever: No Night sweats?: No Weight loss?: No Fatigue?: Yes  Skin Skin rash/lesions?: No Itching?: No  Eyes Blurred vision?: No Double vision?: No  Ears/Nose/Throat Sore throat?: No Sinus problems?: Yes  Hematologic/Lymphatic Swollen glands?: No Easy bruising?: No  Cardiovascular Leg swelling?: No Chest pain?: No  Respiratory Cough?: Yes Shortness of breath?: No  Endocrine Excessive thirst?: No  Musculoskeletal Back pain?: Yes Joint pain?: No  Neurological Headaches?: No Dizziness?: Yes  Psychologic Depression?: No Anxiety?: No  Physical Exam: BP 125/81 mmHg  Pulse 73  Ht 6\' 2"  (1.88 m)  Wt 223 lb 12.8 oz (101.515 kg)  BMI 28.72 kg/m2  Constitutional: Well nourished. Alert and oriented, No acute distress. HEENT: [place] AT, moist mucus membranes. Trachea midline, no masses. Cardiovascular: No clubbing, cyanosis, or edema. Respiratory: Normal respiratory effort, no increased work of breathing. GI: Abdomen is soft, non tender, non distended, no abdominal masses. Liver and spleen not palpable.  No hernias appreciated.  Stool sample for occult testing is not indicated.   GU: No CVA tenderness.  No bladder fullness or masses.   Skin: No rashes, bruises or suspicious lesions. Lymph: No cervical or inguinal adenopathy. Neurologic: Grossly intact, no focal deficits, moving all 4 extremities. Psychiatric: Normal mood and affect.  Laboratory Data: Lab Results  Component Value Date   WBC 6.6 02/11/2015   HGB 14.3 02/11/2015   HCT 42.1 02/11/2015   MCV 90.4 02/11/2015   PLT 175 02/11/2015    Lab Results  Component Value Date   CREATININE 1.06 02/11/2015       Lab Results  Component Value Date   AST 39 02/10/2015   Lab Results  Component Value Date   ALT 54 02/10/2015     Urinalysis Results for orders placed or performed in visit on 02/12/15  CULTURE, URINE COMPREHENSIVE  Result Value Ref Range   Urine Culture, Comprehensive Final report    Result 1 Comment   Microscopic Examination  Result Value Ref Range   WBC, UA 0-5 0 -  5 /hpf   RBC, UA >30 (H) 0 -  2 /hpf   Epithelial Cells (non renal) None seen 0 - 10 /hpf   Mucus, UA Present (A) Not Estab.   Bacteria, UA Few (A) None  seen/Few  Urinalysis, Complete  Result Value Ref Range   Specific Gravity, UA 1.015 1.005 - 1.030   pH, UA 6.0 5.0 - 7.5   Color, UA Yellow Yellow   Appearance Ur Clear Clear   Leukocytes, UA Negative Negative   Protein, UA Negative Negative/Trace   Glucose, UA Negative Negative   Ketones, UA Negative Negative   RBC, UA 3+ (A) Negative   Bilirubin, UA Negative Negative   Urobilinogen, Ur 0.2 0.2 - 1.0 mg/dL   Nitrite, UA Negative Negative   Microscopic Examination See below:     Pertinent Imaging: CLINICAL DATA: Right flank pain with nausea and vomiting.  EXAM: CT ABDOMEN AND PELVIS WITHOUT CONTRAST  TECHNIQUE: Multidetector CT imaging of the abdomen and pelvis was performed following the standard protocol without IV contrast.  COMPARISON: None.  FINDINGS: Lower chest: No significant pulmonary nodules or acute consolidative airspace disease.  Hepatobiliary: Mild-to-moderate diffuse hepatic steatosis. No liver mass. Normal gallbladder with no radiopaque cholelithiasis. No biliary ductal dilatation.  Pancreas: Normal, with no mass or duct dilation.  Spleen: Normal size. No mass.  Adrenals/Urinary Tract: Normal adrenals. Obstructing 5 x 3 mm stone in the proximal right ureter near the ureteropelvic junction, with mild right hydronephrosis. No stones within the kidneys. No left hydronephrosis. No additional ureteral  stones. Normal bladder.  Stomach/Bowel: Grossly normal stomach. Normal caliber small bowel with no small bowel wall thickening. Normal appendix. Normal large bowel with no diverticulosis, large bowel wall thickening or pericolonic fat stranding.  Vascular/Lymphatic: Normal caliber abdominal aorta. No pathologically enlarged lymph nodes in the abdomen or pelvis.  Reproductive: Normal size prostate and seminal vesicles.  Other: No pneumoperitoneum, ascites or focal fluid collection.  Musculoskeletal: No aggressive appearing focal osseous lesions.  IMPRESSION: 1. Obstructing 5 x 3 mm stone in the proximal right ureter near the UPJ, with mild right hydronephrosis. 2. Mild-to-moderate diffuse hepatic steatosis.   Electronically Signed  By: Delbert Phenix M.D.  On: 02/10/2015 14:27      CLINICAL DATA: Kidney stones.  EXAM: ABDOMEN - 1 VIEW  COMPARISON: CT 02/10/2015.  FINDINGS: Soft tissue structures are unremarkable. No bowel distention. Stool noted throughout the colon. Previously identified stone in the upper right ureter is not definitely identified by plain film exam. No acute bony abnormality .  IMPRESSION: Previously identified right upper ureteral tiny stone is not definitely identified by plain film exam. Large amount stool noted throughout the colon.   Electronically Signed  By: Maisie Fus Register  On: 02/13/2015 08:14  Assessment & Plan:    1. Right ureteral stone:   Patient has a 5 mm stone identified on a CT scan. It is not visible on today's KUB. He is still experiencing right flank pain and microscopic hematuria. He will increase fluids over the weekend and continue to strain his urine. He is given a refill on his Dilaudid. He will also take his Flomax once daily. If he should have pain uncontrolled with pain medication, intractable vomiting or fevers and/or chills, he needs to seek treatment to the emergency room immediately.  He'll  return Monday or Tuesday with a KUB prior to his visit.  - Urinalysis, Complete - CULTURE, URINE COMPREHENSIVE  2. Right hydronephrosis:   Once the patient has had definitive treatment for his right ureteral stone, we will obtain a renal ultrasound to ensure the hydronephrosis resolves.  3. Microscopic hematuria:   We will continue to monitor patient's urine after definitive treatment for the stone to ensure the hematuria resolves.  Return for Monday or Tuesday with KUB prior.  These notes generated with voice recognition software. I apologize for typographical errors.  Michiel Cowboy, PA-C  [facility] [address] [place], [place] [address] [phone]

## 2015-02-13 ENCOUNTER — Telehealth: Payer: Self-pay | Admitting: Urology

## 2015-02-13 NOTE — Telephone Encounter (Signed)
Spoke with patient regarding his KUB.  I stated I could not see the stone.  He will be coming in on Monday.  We will get another KUB at that time.

## 2015-02-14 LAB — CULTURE, URINE COMPREHENSIVE

## 2015-02-17 ENCOUNTER — Ambulatory Visit (INDEPENDENT_AMBULATORY_CARE_PROVIDER_SITE_OTHER): Payer: BC Managed Care – PPO | Admitting: Urology

## 2015-02-17 ENCOUNTER — Encounter: Payer: Self-pay | Admitting: Urology

## 2015-02-17 ENCOUNTER — Ambulatory Visit
Admission: RE | Admit: 2015-02-17 | Discharge: 2015-02-17 | Disposition: A | Discharge status: Home / Self Care | Payer: BC Managed Care – PPO | Source: Ambulatory Visit | Attending: Urology | Admitting: Urology

## 2015-02-17 VITALS — BP 131/81 | HR 67 | Ht 74.0 in | Wt 218.4 lb

## 2015-02-17 DIAGNOSIS — N201 Calculus of ureter: Secondary | ICD-10-CM

## 2015-02-17 DIAGNOSIS — K5641 Fecal impaction: Secondary | ICD-10-CM | POA: Insufficient documentation

## 2015-02-17 DIAGNOSIS — R3129 Other microscopic hematuria: Secondary | ICD-10-CM | POA: Insufficient documentation

## 2015-02-17 DIAGNOSIS — N1330 Unspecified hydronephrosis: Secondary | ICD-10-CM | POA: Insufficient documentation

## 2015-02-18 ENCOUNTER — Telehealth: Payer: Self-pay

## 2015-02-18 NOTE — Telephone Encounter (Signed)
Patient is not experiencing any pain at this time and has not in the last 48 hours.  He does not wish to proceed with a RUS at this time.  He will contact our office if he needs anything further.

## 2015-02-18 NOTE — Telephone Encounter (Signed)
-----   Message from Harle Battiest, PA-C sent at 02/17/2015  1:33 PM EST ----- Jeffrey Hopkins is still not visible on KUB.  If he is still having pain, he needs a RUS.

## 2015-02-18 NOTE — Telephone Encounter (Signed)
LMOM

## 2015-02-19 NOTE — Progress Notes (Signed)
Patient could not return to the office after his KUB.  I will contact him with the results.

## 2015-02-19 NOTE — Telephone Encounter (Signed)
A stone can still can still cause blockage of the kidney which is a condition called "silent hydronephrosis."  If this is occuring, it can cause permanent damage to the kidney.  I highly recommend getting the RUS and having another UA.

## 2015-02-20 NOTE — Telephone Encounter (Signed)
LMOM

## 2015-02-21 NOTE — Telephone Encounter (Signed)
LMOM

## 2015-02-24 DIAGNOSIS — E785 Hyperlipidemia, unspecified: Secondary | ICD-10-CM | POA: Insufficient documentation

## 2015-02-24 NOTE — Telephone Encounter (Signed)
LMOM--will sent a letter

## 2015-02-27 ENCOUNTER — Ambulatory Visit: Payer: BC Managed Care – PPO | Admitting: Psychiatry

## 2015-08-12 ENCOUNTER — Ambulatory Visit (INDEPENDENT_AMBULATORY_CARE_PROVIDER_SITE_OTHER): Payer: BC Managed Care – PPO | Admitting: Psychiatry

## 2015-08-12 ENCOUNTER — Encounter: Payer: Self-pay | Admitting: Psychiatry

## 2015-08-12 VITALS — BP 122/80 | HR 72 | Temp 97.6°F | Ht 74.0 in | Wt 218.8 lb

## 2015-08-12 DIAGNOSIS — F3160 Bipolar disorder, current episode mixed, unspecified: Secondary | ICD-10-CM | POA: Diagnosis not present

## 2015-08-12 MED ORDER — CLONAZEPAM 1 MG PO TABS: 1.0000 mg | ORAL_TABLET | Freq: Every day | ORAL | Status: DC

## 2015-08-12 MED ORDER — LAMOTRIGINE 200 MG PO TABS: 200.0000 mg | ORAL_TABLET | Freq: Every morning | ORAL | Status: DC

## 2015-08-12 NOTE — Progress Notes (Signed)
BH MD/PA/NP OP Progress Note  08/12/2015 12:24 PM Jeffrey BeginSean Hopkins  MRN:  [id number]  Subjective:   Patient is a 45 year old male who presented for the follow-up appointment. He was seen for the appointment after he was last seen in November. He reported that he is enjoying his summer break. He reported that he just ran out of his medications. He is not having any adverse effects of medication. He still taking his Klonopin and lamotrigine as prescribed he is taking the Klonopin on a when necessary basis. He currently denied having any suicidal homicidal ideations or plans.    He denied any adverse effects to the medications.  Chief Complaint:  Chief Complaint    Follow-up; Medication Refill     Visit Diagnosis:     ICD-9-CM ICD-10-CM   1. Bipolar affective disorder, current episode mixed, current episode severity unspecified (HCC) 296.60 F31.60     Past Medical History:  Past Medical History  Diagnosis Date  . Bipolar disorder (HCC)   . Anxiety   . Depression   . Kidney stone     Past Surgical History  Procedure Laterality Date  . None     Family History:  Family History  Problem Relation Age of Onset  . Anxiety disorder Father   . Bipolar disorder Maternal Uncle   . Skin cancer Mother   . Allergies Sister   . Kidney disease Neg Hx   . Prostate cancer Neg Hx   . Colon cancer Paternal Grandfather    Social History:  Social History   Social History  . Marital Status: Married    Spouse Name: N/A  . Number of Children: N/A  . Years of Education: N/A   Social History Main Topics  . Smoking status: Never Smoker   . Smokeless tobacco: Never Used  . Alcohol Use: 3.6 oz/week    0 Glasses of wine, 6 Cans of beer, 0 Shots of liquor per week     Comment: 2-3 times per week   . Drug Use: No  . Sexual Activity: Yes    Birth Control/ Protection: None   Other Topics Concern  . None   Social History Narrative   Additional History:  He is a high Engineer, siteschool teacher in a local  school. He is awaiting for the school to be restarted again  Assessment:   Musculoskeletal: Strength & Muscle Tone: within normal limits Gait & Station: normal Patient leans: N/A  Psychiatric Specialty Exam: HPI  ROS  Blood pressure 122/80, pulse 72, temperature 97.6 F (36.4 C), temperature source Tympanic, height 6\' 2"  (1.88 m), weight 218 lb 12.8 oz (99.247 kg), SpO2 96 %.Body mass index is 28.08 kg/(m^2).  General Appearance: Casual  Eye Contact:  Fair  Speech:  Clear and Coherent and Normal Rate  Volume:  Normal  Mood:  Euthymic  Affect:  Congruent  Thought Process:  Coherent  Orientation:  Full (Time, Place, and Person)  Thought Content:  WDL  Suicidal Thoughts:  No  Homicidal Thoughts:  No  Memory:  Immediate;   Fair  Judgement:  Fair  Insight:  Fair  Psychomotor Activity:  Normal  Concentration:  Fair  Recall:  [employer]  Fund of Knowledge: Fair  Language: Fair  Akathisia:  No  Handed:  Right  AIMS (if indicated):    Assets:  Communication Skills Desire for Improvement Physical Health Social Support Talents/Skills  ADL's:  Intact  Cognition: WNL  Sleep:     Is the patient at risk to self?  No. Has the patient been a risk to self in the past 6 months?  No. Has the patient been a risk to self within the distant past?  No. Is the patient a risk to others?  No. Has the patient been a risk to others in the past 6 months?  No. Has the patient been a risk to others within the distant past?  No.  Current Medications: Current Outpatient Prescriptions  Medication Sig Dispense Refill  . amoxicillin (AMOXIL) 500 MG capsule take 1 capsule by mouth every 8 hours until finished  0  . clonazePAM (KLONOPIN) 0.5 MG tablet Take 1 tablet (0.5 mg total) by mouth at bedtime. 30 tablet 1  . cyanocobalamin (V-R VITAMIN B-12) 500 MCG tablet Take 1,000 mcg by mouth.    Marland Kitchen HYDROmorphone (DILAUDID) 2 MG tablet Take 1 tablet (2 mg total) by mouth every 12 (twelve) hours as needed for  severe pain. 20 tablet 0  . HYDROmorphone (DILAUDID) 2 MG tablet Take 1 tablet (2 mg total) by mouth every 4 (four) hours as needed for severe pain. 30 tablet 0  . ibuprofen (ADVIL,MOTRIN) 800 MG tablet Take 1 tablet (800 mg total) by mouth every 8 (eight) hours as needed. 30 tablet 0  . lamoTRIgine (LAMICTAL) 200 MG tablet Take 1 tablet (200 mg total) by mouth every morning. 90 tablet 2  . tamsulosin (FLOMAX) 0.4 MG CAPS capsule Take 1 capsule (0.4 mg total) by mouth daily. 30 capsule 0  . traMADol (ULTRAM) 50 MG tablet take 1 tablet by mouth every 6 hours if needed  0   No current facility-administered medications for this visit.    Medical Decision Making:  Review of Psycho-Social Stressors (1) and Review of Last Therapy Session (1)  Treatment Plan Summary:Medication management      Mood symptoms  Medication refill for the next 3 months. He continues to take Klonopin 1 mg on a when necessary basis at bedtime.  He will follow up in 3 months or earlier.   More than 50% of the time spent in psychoeducation, counseling and coordination of care.    Time spent with the patient 25 minutes   This note was generated in part or whole with voice recognition software. Voice regonition is usually quite accurate but there are transcription errors that can and very often do occur. I apologize for any typographical errors that were not detected and corrected.    Brandy Hale 08/12/2015, 12:24 PM

## 2015-11-10 DIAGNOSIS — R42 Dizziness and giddiness: Secondary | ICD-10-CM | POA: Insufficient documentation

## 2016-02-09 IMAGING — CT CT RENAL STONE PROTOCOL
1 of 2 series · 4 of 32 positions shown, 9 images · non-contrast
Comparison: None.

CLINICAL DATA: Right flank pain with nausea and vomiting.

EXAM:
CT ABDOMEN AND PELVIS WITHOUT CONTRAST
TECHNIQUE: Multidetector CT imaging of the abdomen and pelvis was performed
following the standard protocol without IV contrast.

[Series 4: lung windows · axial · 0.76mm/px · z∈[-160,-80]mm · 4 of 28 slices shown, 9 images]
[im 6/28  soft-tissue]
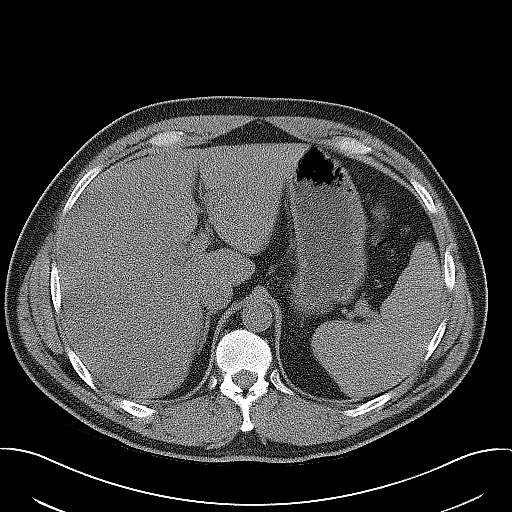
[im 6/28  lung]
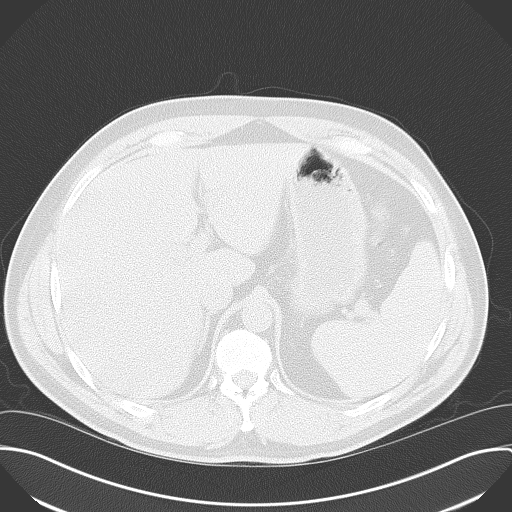
[im 6/28  bone]
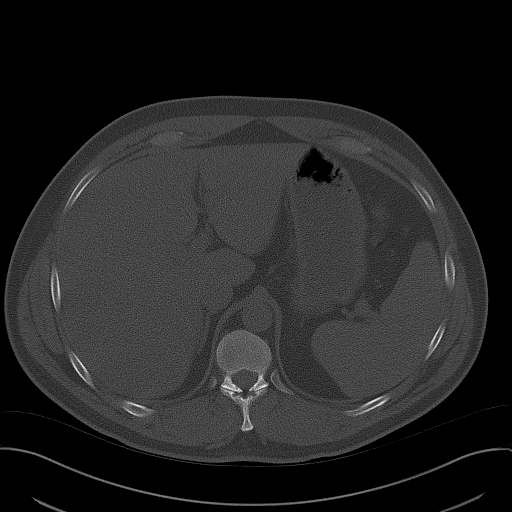
[im 11/28  soft-tissue]
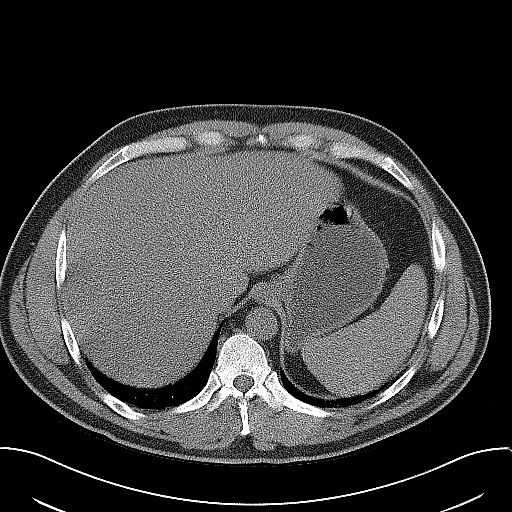
[im 11/28  lung]
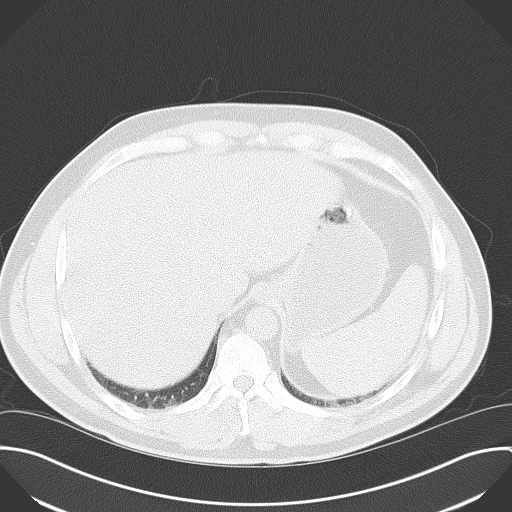
[im 17/28  soft-tissue]
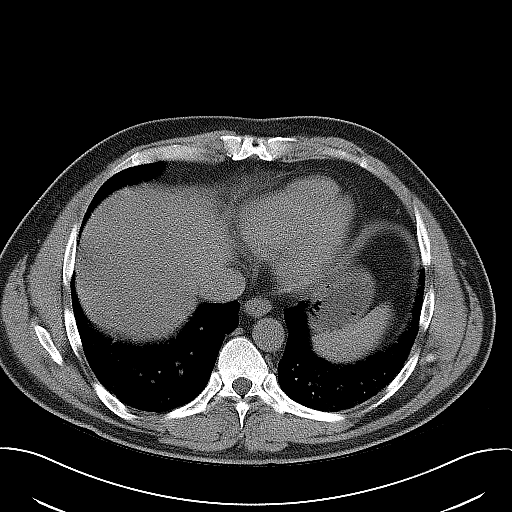
[im 17/28  lung]
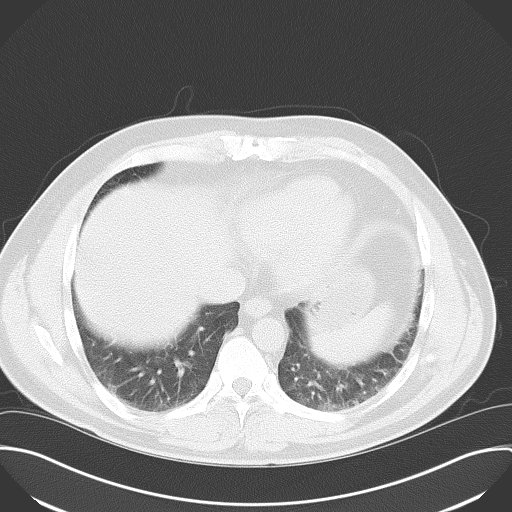
[im 22/28  soft-tissue]
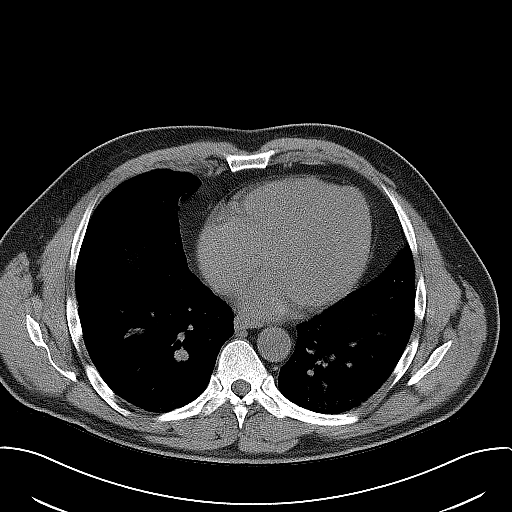
[im 22/28  lung]
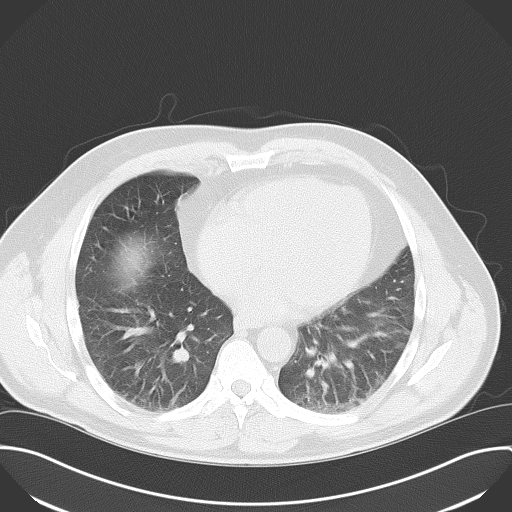

[4 of 32 positions shown; findings below may reference images not displayed]

FINDINGS: Lower chest: No significant pulmonary nodules or acute consolidative
airspace disease.

Hepatobiliary: Mild-to-moderate diffuse hepatic steatosis. No liver
mass. Normal gallbladder with no radiopaque cholelithiasis. No
biliary ductal dilatation.

Pancreas: Normal, with no mass or duct dilation.

Spleen: Normal size. No mass.

Adrenals/Urinary Tract: Normal adrenals. Obstructing 5 x 3 mm stone
in the proximal right ureter near the ureteropelvic junction, with
mild right hydronephrosis. No stones within the kidneys. No left
hydronephrosis. No additional ureteral stones. Normal bladder.

Stomach/Bowel: Grossly normal stomach. Normal caliber small bowel
with no small bowel wall thickening. Normal appendix. Normal large
bowel with no diverticulosis, large bowel wall thickening or
pericolonic fat stranding.

Vascular/Lymphatic: Normal caliber abdominal aorta. No
pathologically enlarged lymph nodes in the abdomen or pelvis.

Reproductive: Normal size prostate and seminal vesicles.

Other: No pneumoperitoneum, ascites or focal fluid collection.

Musculoskeletal: No aggressive appearing focal osseous lesions.
IMPRESSION: 1. Obstructing 5 x 3 mm stone in the proximal right ureter near the
UPJ, with mild right hydronephrosis.
2. Mild-to-moderate diffuse hepatic steatosis.

## 2016-02-16 IMAGING — CR DG ABDOMEN 1V
1 series · 2 of 2 positions shown · non-contrast
Comparison: 02/12/2015.

CLINICAL DATA: Ureteral stone.

EXAM:
ABDOMEN - 1 VIEW

[Series 1: dg abd 1 view · 0.14mm/px · 2 of 2 slices shown]
[im 1/2]
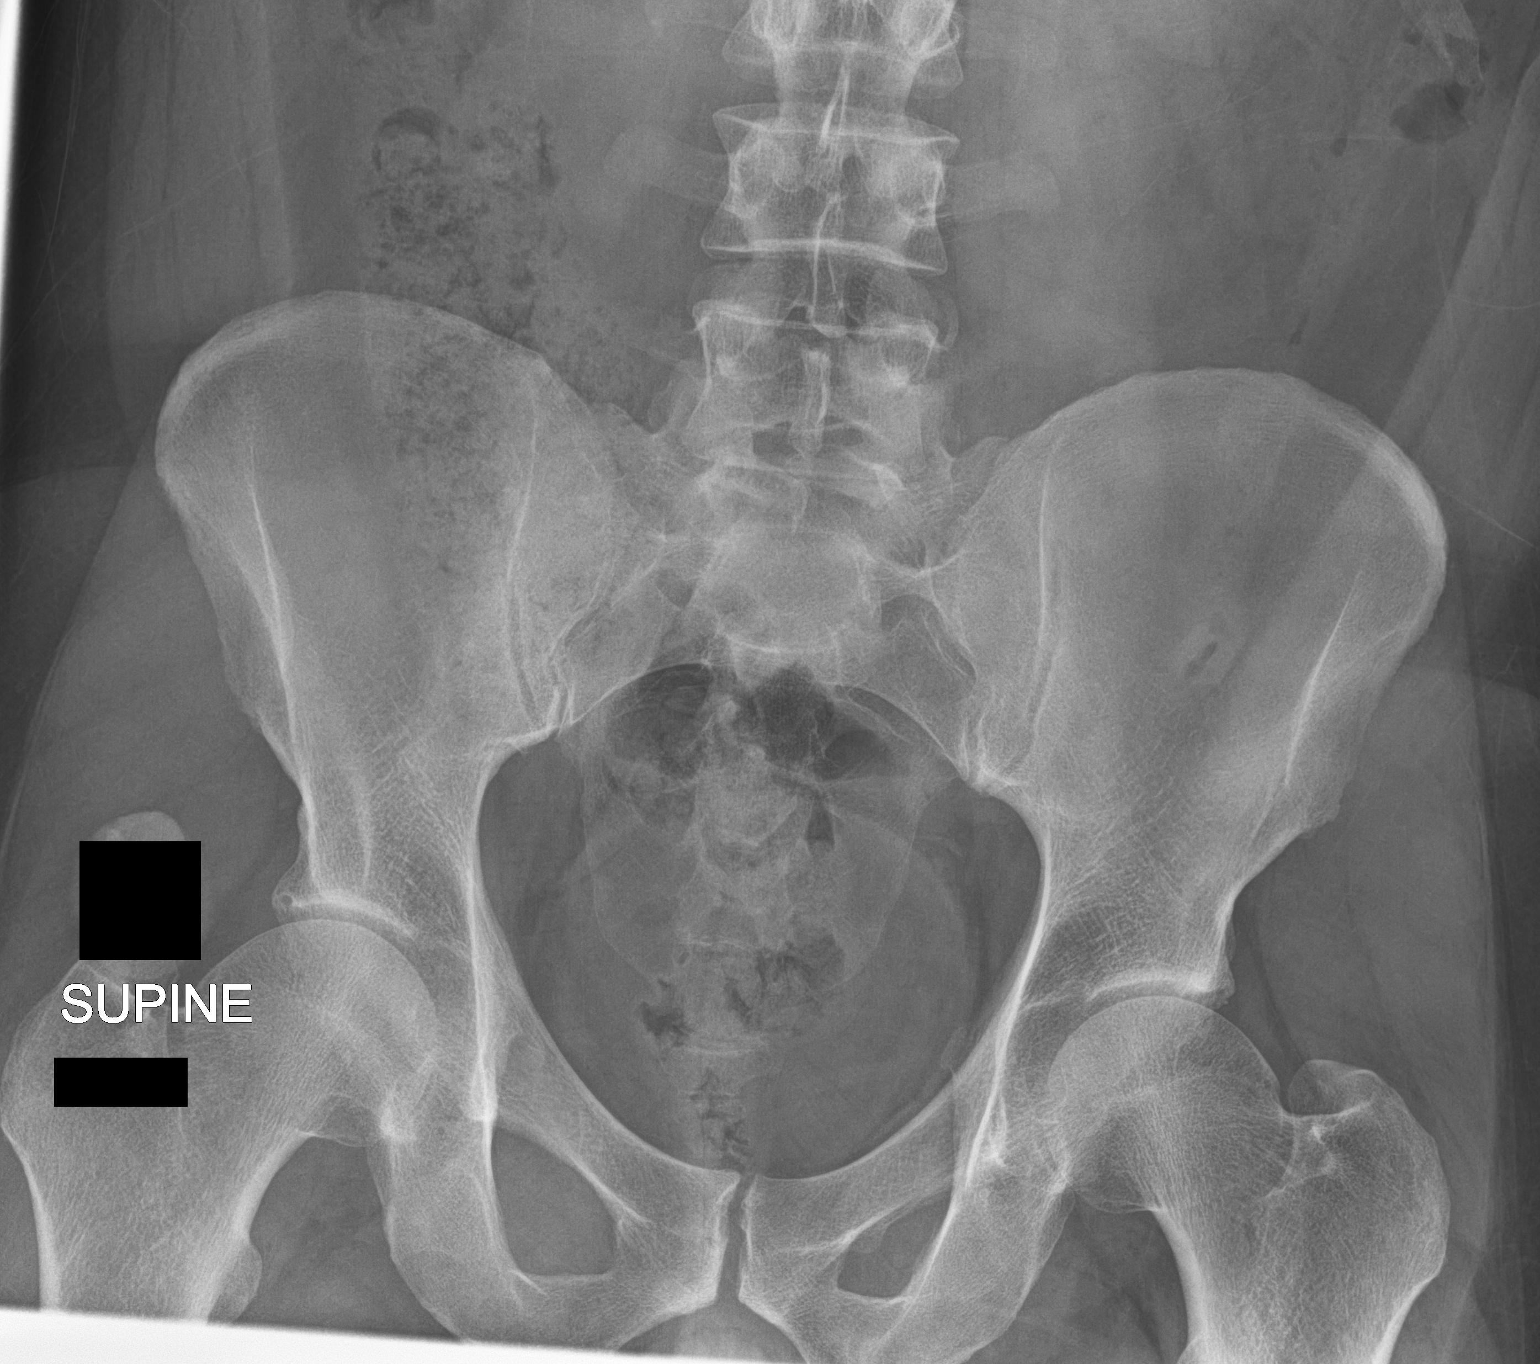
[im 2/2]
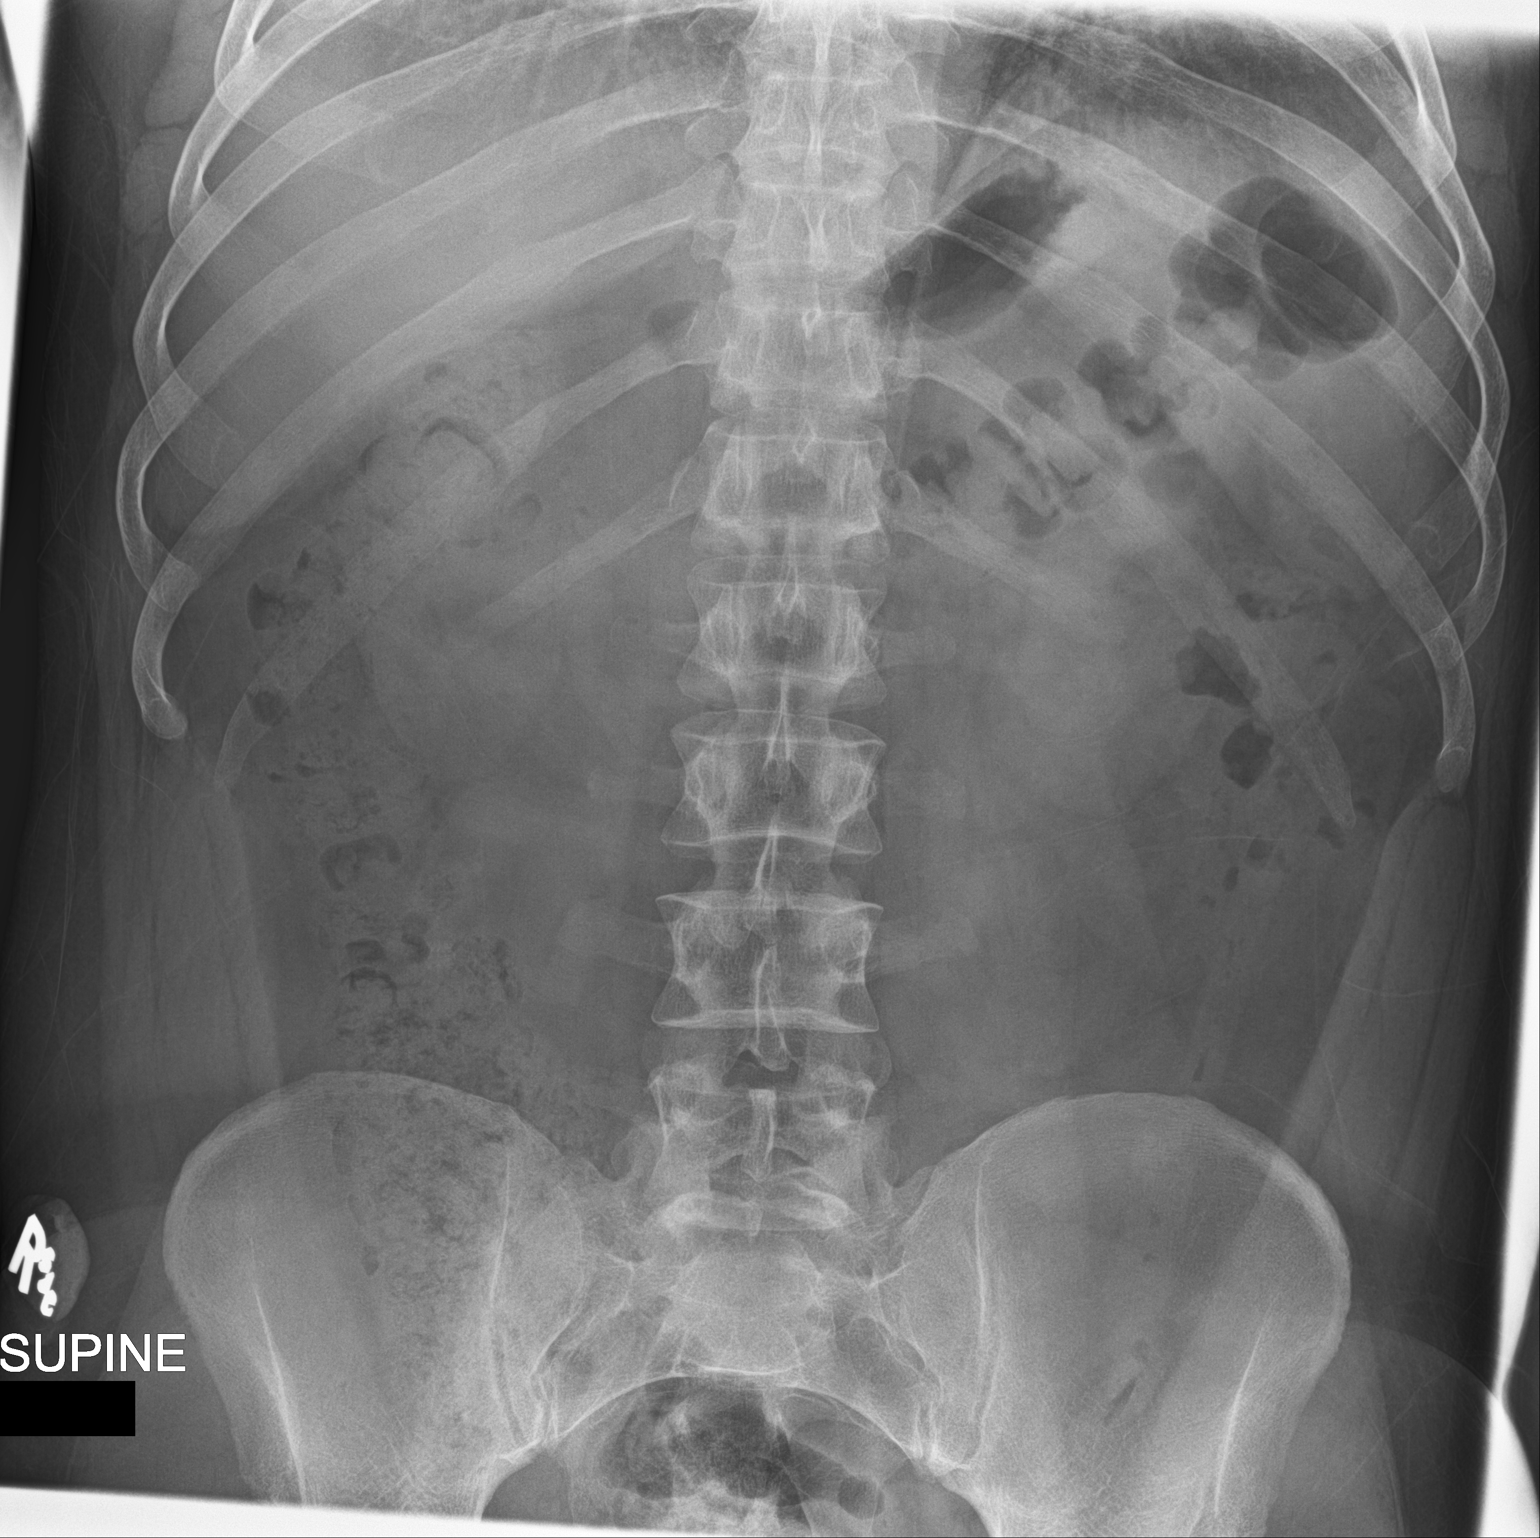

[2 of 2 positions shown; findings below may reference images not displayed]

FINDINGS: Soft tissue structures are unremarkable. Gas pattern is nonspecific.
Stool noted throughout the colon. No free air. No pathologic
intra-abdominal calcification noted. No acute bony abnormality
identified.
IMPRESSION: 1. No pathologic intra-abdominal calcification.

2.  Stool noted throughout the colon.  No bowel distention.

## 2016-06-24 ENCOUNTER — Telehealth: Payer: Self-pay

## 2016-06-24 NOTE — Telephone Encounter (Signed)
pt called states he needs a refill on his lamictal 200mg  pt was last seen on  08-12-15  next appt  06-30-16  Pt was told that dr. Garnetta Buddyfaheem would not be in the office until Monday and that we do not have a doctor in the office.

## 2016-06-28 ENCOUNTER — Encounter: Payer: Self-pay | Admitting: Psychiatry

## 2016-06-28 ENCOUNTER — Ambulatory Visit (INDEPENDENT_AMBULATORY_CARE_PROVIDER_SITE_OTHER): Payer: BC Managed Care – PPO | Admitting: Psychiatry

## 2016-06-28 VITALS — BP 135/86 | HR 76 | Temp 98.6°F | Wt 221.6 lb

## 2016-06-28 DIAGNOSIS — F3160 Bipolar disorder, current episode mixed, unspecified: Secondary | ICD-10-CM

## 2016-06-28 MED ORDER — LAMOTRIGINE 200 MG PO TABS: 200.0000 mg | ORAL_TABLET | Freq: Every morning | ORAL | 1 refills | Status: DC

## 2016-06-28 NOTE — Telephone Encounter (Signed)
per dr. Garnetta Buddyfaheem call pt and have them come in today at 2:45 pt has not been seen since 08-12-15 pt agreed to come in today.

## 2016-06-28 NOTE — Progress Notes (Signed)
BH MD/PA/NP OP Progress Note  06/28/2016 3:29 PM Jeffrey BeginSean Hopkins  MRN:  [id number]  Subjective:   Patient is a 46 year old male who presented for the follow-up appointment after he was last seen one year ago. He reported that he was busy in his school for almost a year and was unable to keep his appointment. He reported that he realizes that he is running out of his lamotrigine. His wife is also upset with him that how he can forget about the appointment. He reported that he ran out of his lamotrigine almost last week and was taking pills on alternate days. He started having withdrawal symptoms. He reported that he stabilized on his medication. He is not taking Klonopin and has been taking melatonin to help him sleep. He appears calm and alert during the interview. He is also on a part-time job over the summer. No acute issues noted at this time.  Patient currently denied having any suicidal homicidal ideations or plans.  he appeared interactive during the interview.  He denied any adverse effects to the medications.  Chief Complaint:  Chief Complaint    Follow-up; Medication Refill     Visit Diagnosis:     ICD-9-CM ICD-10-CM   1. Bipolar affective disorder, current episode mixed, current episode severity unspecified (HCC) 296.60 F31.60     Past Medical History:  Past Medical History:  Diagnosis Date  . Anxiety   . Bipolar disorder (HCC)   . Depression   . Kidney stone     Past Surgical History:  Procedure Laterality Date  . none     Family History:  Family History  Problem Relation Age of Onset  . Anxiety disorder Father   . Bipolar disorder Maternal Uncle   . Skin cancer Mother   . Allergies Sister   . Kidney disease Neg Hx   . Prostate cancer Neg Hx   . Colon cancer Paternal Grandfather    Social History:  Social History   Social History  . Marital status: Married    Spouse name: N/A  . Number of children: N/A  . Years of education: N/A   Social History Main Topics   . Smoking status: Never Smoker  . Smokeless tobacco: Never Used  . Alcohol use 3.6 oz/week    6 Cans of beer per week     Comment: 2-3 times per week   . Drug use: No  . Sexual activity: Yes    Birth control/ protection: None   Other Topics Concern  . None   Social History Narrative  . 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  General Appearance: Casual  Eye Contact:  Fair  Speech:  Clear and Coherent and Normal Rate  Volume:  Normal  Mood:  Euthymic  Affect:  Congruent  Thought Process:  Coherent  Orientation:  Full (Time, Place, and Person)  Thought Content:  WDL  Suicidal Thoughts:  No  Homicidal Thoughts:  No  Memory:  Immediate;   Fair  Judgement:  Fair  Insight:  Fair  Psychomotor Activity:  Normal  Concentration:  Fair  Recall:  Jennelle Human of Knowledge: Fair  Language: Fair  Akathisia:  No  Handed:  Right  AIMS (if indicated):    Assets:  Communication Skills Desire for Improvement Physical Health Social Support Talents/Skills  ADL's:  Intact  Cognition: WNL  Sleep:     Is the patient at risk to self?  No. Has the patient been a risk to self in the past 6 months?  No. Has the patient been a risk to self within the distant past?  No. Is the patient a risk to others?  No. Has the patient been a risk to others in the past 6 months?  No. Has the patient been a risk to others within the distant past?  No.  Current Medications: Current Outpatient Prescriptions  Medication Sig Dispense Refill  . cyanocobalamin (V-R VITAMIN B-12) 500 MCG tablet Take 1,000 mcg  by mouth.    Marland Kitchen ibuprofen (ADVIL,MOTRIN) 800 MG tablet Take 1 tablet (800 mg total) by mouth every 8 (eight) hours as needed. 30 tablet 0  . lamoTRIgine (LAMICTAL) 200 MG tablet Take 1 tablet (200 mg total) by mouth every morning. 90 tablet 1   No current facility-administered medications for this visit.     Medical Decision Making:  Review of Psycho-Social Stressors (1) and Review of Last Therapy Session (1)  Treatment Plan Summary:Medication management      Mood symptoms  Medication refill for the next 3 months.  He will follow up in 3 months or earlier.   More than 50% of the time spent in psychoeducation, counseling and coordination of care.    Time spent with the patient 25 minutes   This note was generated in part or whole with voice recognition software. Voice regonition is usually quite accurate but there are transcription errors that can and very often do occur. I apologize for any typographical errors that were not detected and corrected.    Brandy Hale 06/28/2016, 3:29 PM

## 2016-06-28 NOTE — Telephone Encounter (Signed)
pt called again to check on the status of him getting enough medication until his appt on  06-30-16  pt states he had to go the weekend without and he got a headache.

## 2016-06-30 ENCOUNTER — Ambulatory Visit: Payer: BC Managed Care – PPO | Admitting: Psychiatry

## 2016-09-13 ENCOUNTER — Ambulatory Visit: Payer: BC Managed Care – PPO | Admitting: Psychiatry

## 2016-09-21 ENCOUNTER — Ambulatory Visit (INDEPENDENT_AMBULATORY_CARE_PROVIDER_SITE_OTHER): Payer: BC Managed Care – PPO | Admitting: Psychiatry

## 2016-09-21 ENCOUNTER — Encounter: Payer: Self-pay | Admitting: Psychiatry

## 2016-09-21 VITALS — BP 133/88 | HR 78 | Temp 97.9°F | Wt 222.6 lb

## 2016-09-21 DIAGNOSIS — F3160 Bipolar disorder, current episode mixed, unspecified: Secondary | ICD-10-CM | POA: Diagnosis not present

## 2016-09-21 MED ORDER — LAMOTRIGINE 200 MG PO TABS: 200.0000 mg | ORAL_TABLET | Freq: Every morning | ORAL | 1 refills | Status: DC

## 2016-09-21 NOTE — Progress Notes (Signed)
BH MD/PA/NP OP Progress Note  09/21/2016 12:39 PM Jeffrey Hopkins  MRN:  [id number]  Subjective:   Patient is a 46 year old male who presented for the follow-up appointment . He reported tha the has recently started his school 2 days ago. He stated that he is busy now. He appeared well groomed and was calm and alert during the interview. He stated that he has recently moved to [facility] and was very busy and it was hectic for him. However he is now settled in his new house. He has been sleeping well as he has bought a mattress and it has been helping with his sleep. Patient reported that he feels that the medication has have been helping him and he does not have any acute issues at this time. He currently denied having any suicidal ideations or plans. He denied having any perceptual disturbances.  He has been compliant with his medications and they're helping with his mood symptoms. We discussed about his medications at  length.    He denied any adverse effects to the medications.  Chief Complaint:   Visit Diagnosis:     ICD-10-CM   1. Bipolar affective disorder, current episode mixed, current episode severity unspecified (HCC) F31.60     Past Medical History:  Past Medical History:  Diagnosis Date  . Anxiety   . Bipolar disorder (HCC)   . Depression   . Kidney stone     Past Surgical History:  Procedure Laterality Date  . none     Family History:  Family History  Problem Relation Age of Onset  . Anxiety disorder Father   . Bipolar disorder Maternal Uncle   . Skin cancer Mother   . Allergies Sister   . Kidney disease Neg Hx   . Prostate cancer Neg Hx   . Colon cancer Paternal Grandfather    Social History:  Social History   Social History  . Marital status: Married    Spouse name: N/A  . Number of children: N/A  . Years of education: N/A   Social History Main Topics  . Smoking status: Never Smoker  . Smokeless tobacco: Never Used  . Alcohol use 3.6 oz/week    6 Cans of  beer per week     Comment: 2-3 times per week   . Drug use: No  . Sexual activity: Yes    Birth control/ protection: None   Other Topics Concern  . Not on file   Social History Narrative  . 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  General Appearance: Casual  Eye Contact:  Fair  Speech:  Clear and Coherent and Normal Rate  Volume:  Normal  Mood:  Euthymic  Affect:  Congruent  Thought Process:  Coherent  Orientation:  Full (Time, Place, and Person)  Thought Content:  WDL  Suicidal Thoughts:  No  Homicidal Thoughts:  No  Memory:  Immediate;   Fair  Judgement:  Fair  Insight:  Fair  Psychomotor Activity:  Normal  Concentration:  Fair  Recall:  [employer] of Knowledge: Fair  Language: Fair  Akathisia:  No  Handed:  Right  AIMS (if indicated):    Assets:  Communication Skills Desire for Improvement Physical Health Social Support Talents/Skills  ADL's:  Intact  Cognition: WNL  Sleep:     Is the patient at risk to self?  No. Has the patient been a risk to self in the past 6 months?  No. Has the patient been a risk to self within the distant past?  No. Is the patient a risk to others?  No. Has the patient been a risk to others in the past 6 months?  No. Has the patient been a risk to others within the distant past?  No.  Current Medications: Current Outpatient Prescriptions  Medication Sig Dispense Refill  . cyanocobalamin (V-R VITAMIN B-12) 500 MCG tablet Take 1,000 mcg by mouth.    Marland Kitchen ibuprofen (ADVIL,MOTRIN) 800 MG tablet Take 1 tablet (800 mg total) by mouth every 8 (eight) hours as  needed. 30 tablet 0  . lamoTRIgine (LAMICTAL) 200 MG tablet Take 1 tablet (200 mg total) by mouth every morning. 90 tablet 1   No current facility-administered medications for this visit.     Medical Decision Making:  Review of Psycho-Social Stressors (1) and Review of Last Therapy Session (1)  Treatment Plan Summary:Medication management      Mood symptoms Continue lamotrigine as prescribed. He will follow up in 3 months or earlier.   More than 50% of the time spent in psychoeducation, counseling and coordination of care.    Time spent with the patient 25 minutes   This note was generated in part or whole with voice recognition software. Voice regonition is usually quite accurate but there are transcription errors that can and very often do occur. I apologize for any typographical errors that were not detected and corrected.    Brandy Hale 09/21/2016, 12:39 PM

## 2016-12-13 ENCOUNTER — Other Ambulatory Visit: Payer: Self-pay

## 2016-12-13 ENCOUNTER — Ambulatory Visit: Payer: BC Managed Care – PPO | Admitting: Psychiatry

## 2016-12-13 ENCOUNTER — Encounter: Payer: Self-pay | Admitting: Psychiatry

## 2016-12-13 VITALS — BP 150/79 | HR 68 | Temp 98.2°F | Wt 221.4 lb

## 2016-12-13 DIAGNOSIS — F3160 Bipolar disorder, current episode mixed, unspecified: Secondary | ICD-10-CM | POA: Diagnosis not present

## 2016-12-13 MED ORDER — ARIPIPRAZOLE 2 MG PO TABS: 2.0000 mg | ORAL_TABLET | Freq: Every day | ORAL | 1 refills | Status: DC

## 2016-12-13 MED ORDER — LAMOTRIGINE 200 MG PO TABS: 200.0000 mg | ORAL_TABLET | Freq: Every morning | ORAL | 1 refills | Status: DC

## 2016-12-13 MED ORDER — CLONAZEPAM 0.5 MG PO TABS: 0.5000 mg | ORAL_TABLET | Freq: Every day | ORAL | 1 refills | Status: DC

## 2016-12-13 NOTE — Progress Notes (Signed)
BH MD/PA/NP OP Progress Note  12/13/2016 12:39 PM Jeffrey Hopkins  MRN:  [id number]  Subjective:   Patient is a 46 year old male who presented for the follow-up appointment . He reported that his wife is noticed that he is becoming more impulsive and short tempered. He reported that he has not been sleeping well. He reported that he is compliant with his medications. He might be stressed out related to the work. He reported that he is interested in having his medications adjusted. Patient reported that he has been compliant with his medications. We discussed about adding Abilify and Klonopin at night. He agreed with the plan. He stated that he is planning to spend holidays with his family. He appears calm and alert during the interview.  He currently denied having any suicidal ideations or plans. He denied having any perceptual disturbances.  He denied any adverse effects to the medications.  Chief Complaint:  Chief Complaint    Follow-up; Medication Refill     Visit Diagnosis:     ICD-10-CM   1. Bipolar affective disorder, current episode mixed, current episode severity unspecified (HCC) F31.60     Past Medical History:  Past Medical History:  Diagnosis Date  . Anxiety   . Bipolar disorder (HCC)   . Depression   . Kidney stone     Past Surgical History:  Procedure Laterality Date  . none     Family History:  Family History  Problem Relation Age of Onset  . Anxiety disorder Father   . Skin cancer Mother   . Allergies Sister   . Bipolar disorder Maternal Uncle   . Colon cancer Paternal Grandfather   . Kidney disease Neg Hx   . Prostate cancer Neg Hx    Social History:  Social History   Socioeconomic History  . Marital status: Married    Spouse name: JENNIFER  . Number of children: 2  . Years of education: None  . Highest education level: Master's degree (e.g., MA, MS, MEng, MEd, MSW, MBA)  Social Needs  . Financial resource strain: Not hard at all  . Food insecurity  - worry: Never true  . Food insecurity - inability: Never true  . Transportation needs - medical: No  . Transportation needs - non-medical: No  Occupational History    Comment: [employer]ULL[employer]  Tobacco Use  . Smoking status: Never Smoker  . Smokeless tobacco: Never Used  Substance and Sexual Activity  . Alcohol use: Yes    Alcohol/week: 3.6 oz    Types: 6 Cans of beer per week    Comment: 2-3 times per week   . Drug use: No  . Sexual activity: Yes    Birth control/protection: None  Other Topics Concern  . None  Social History Narrative  . 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  General Appearance: Casual  Eye Contact:  Fair  Speech:  Clear and Coherent and Normal Rate  Volume:  Normal  Mood:  Euthymic  Affect:  Congruent  Thought Process:  Coherent  Orientation:  Full (Time,  Place, and Person)  Thought Content:  WDL  Suicidal Thoughts:  No  Homicidal Thoughts:  No  Memory:  Immediate;   Fair  Judgement:  Fair  Insight:  Fair  Psychomotor Activity:  Normal  Concentration:  Fair  Recall:  [employer]  Fund of Knowledge: Fair  Language: Fair  Akathisia:  No  Handed:  Right  AIMS (if indicated):    Assets:  Communication Skills Desire for Improvement Physical Health Social Support Talents/Skills  ADL's:  Intact  Cognition: WNL  Sleep:     Is the patient at risk to self?  No. Has the patient been a risk to self in the past 6 months?  No. Has the patient been a risk to self within the distant past?  No. Is the patient a risk to others?  No. Has the  patient been a risk to others in the past 6 months?  No. Has the patient been a risk to others within the distant past?  No.  Current Medications: Current Outpatient Medications  Medication Sig Dispense Refill  . ARIPiprazole (ABILIFY) 2 MG tablet Take 1 tablet (2 mg total) daily by mouth. 30 tablet 1  . clonazePAM (KLONOPIN) 0.5 MG tablet Take 1 tablet (0.5 mg total) at bedtime by mouth. 30 tablet 1  . cyanocobalamin (V-R VITAMIN B-12) 500 MCG tablet Take 1,000 mcg by mouth.    Marland Kitchen. ibuprofen (ADVIL,MOTRIN) 800 MG tablet Take 1 tablet (800 mg total) by mouth every 8 (eight) hours as needed. 30 tablet 0  . lamoTRIgine (LAMICTAL) 200 MG tablet Take 1 tablet (200 mg total) every morning by mouth. 90 tablet 1   No current facility-administered medications for this visit.     Medical Decision Making:  Review of Psycho-Social Stressors (1) and Review of Last Therapy Session (1)  Treatment Plan Summary:Medication management      Mood symptoms Continue lamotrigine as prescribed. I will add Abilify 2 mg by mouth daily. I will add Klonopin 0.5 mg by mouth daily at bedtime for insomnia Discussed with him about the side effects of the medications and he agreed with the plan  He will follow up in 2 months or earlier.   More than 50% of the time spent in psychoeducation, counseling and coordination of care.    Time spent with the patient 25 minutes   This note was generated in part or whole with voice recognition software. Voice regonition is usually quite accurate but there are transcription errors that can and very often do occur. I apologize for any typographical errors that were not detected and corrected.    Brandy HaleUzma Auburn Hester 12/13/2016, 12:39 PM

## 2017-02-16 ENCOUNTER — Telehealth: Payer: Self-pay

## 2017-02-16 NOTE — Telephone Encounter (Signed)
  pt called left message that he needed a refill on his abilify he will not have enough to get to appt 02-21-17 with dr. Garnetta Buddyfaheem.      Disp Refills Start End   ARIPiprazole (ABILIFY) 2 MG tablet 30 tablet 1 12/13/2016    Sig - Route: Take 1 tablet (2 mg total) daily by mouth. - Oral   Sent to pharmacy as: ARIPiprazole (ABILIFY) 2 MG tablet   E-Prescribing Status: Receipt confirmed by pharmacy (12/13/2016 12:12 PM EST)

## 2017-02-17 ENCOUNTER — Other Ambulatory Visit: Payer: Self-pay | Admitting: Psychiatry

## 2017-02-17 DIAGNOSIS — F3162 Bipolar disorder, current episode mixed, moderate: Secondary | ICD-10-CM

## 2017-02-17 MED ORDER — ARIPIPRAZOLE 2 MG PO TABS: 2.0000 mg | ORAL_TABLET | Freq: Every day | ORAL | 0 refills | Status: DC

## 2017-02-17 NOTE — Telephone Encounter (Signed)
Sent Abilify 2 mg for 30 days to [facility].

## 2017-02-21 ENCOUNTER — Ambulatory Visit: Payer: BC Managed Care – PPO | Admitting: Psychiatry

## 2017-02-21 ENCOUNTER — Other Ambulatory Visit: Payer: Self-pay

## 2017-02-21 ENCOUNTER — Encounter: Payer: Self-pay | Admitting: Psychiatry

## 2017-02-21 VITALS — BP 147/90 | HR 70 | Temp 98.0°F | Wt 223.2 lb

## 2017-02-21 DIAGNOSIS — F3160 Bipolar disorder, current episode mixed, unspecified: Secondary | ICD-10-CM | POA: Diagnosis not present

## 2017-02-21 DIAGNOSIS — F3162 Bipolar disorder, current episode mixed, moderate: Secondary | ICD-10-CM | POA: Diagnosis not present

## 2017-02-21 MED ORDER — ARIPIPRAZOLE 2 MG PO TABS: 2.0000 mg | ORAL_TABLET | Freq: Every day | ORAL | 2 refills | Status: DC

## 2017-02-21 MED ORDER — ARIPIPRAZOLE 2 MG PO TABS: 2.0000 mg | ORAL_TABLET | Freq: Every day | ORAL | 0 refills | Status: DC

## 2017-02-21 MED ORDER — CLONAZEPAM 0.5 MG PO TABS: 0.5000 mg | ORAL_TABLET | Freq: Every day | ORAL | 2 refills | Status: DC

## 2017-02-21 MED ORDER — LAMOTRIGINE 200 MG PO TABS: 200.0000 mg | ORAL_TABLET | Freq: Every morning | ORAL | 1 refills | Status: DC

## 2017-02-21 NOTE — Progress Notes (Signed)
BH MD/PA/NP OP Progress Note  02/21/2017 12:20 PM Jeffrey Hopkins  MRN:  [id number]  Subjective:   Patient is a 47 year old married male who presented for the follow-up appointment . He reported that he has noticed significant improvement since he was started on Abilify. He reported that his symptoms are getting better and he feels more calm and relaxed. That the current combination of medications helping him. He reported that he ran out of the medication 4 days ago and he called for the refill but did not receive his medication. He reported that he wants to continue his medication. He takes a Klonopin on a when necessary basis. He denied having any suicidal homicidal ideation or plans. He remains receptive to his medications.     He denied any adverse effects to the medications.  Chief Complaint:  Chief Complaint    Follow-up; Medication Refill     Visit Diagnosis:     ICD-10-CM   1. Bipolar affective disorder, current episode mixed, current episode severity unspecified (HCC) F31.60   2. Bipolar disorder, current episode mixed, moderate (HCC) F31.62 ARIPiprazole (ABILIFY) 2 MG tablet    DISCONTINUED: ARIPiprazole (ABILIFY) 2 MG tablet    Past Medical History:  Past Medical History:  Diagnosis Date  . Anxiety   . Bipolar disorder (HCC)   . Depression   . Kidney stone     Past Surgical History:  Procedure Laterality Date  . none     Family History:  Family History  Problem Relation Age of Onset  . Anxiety disorder Father   . Skin cancer Mother   . Allergies Sister   . Bipolar disorder Maternal Uncle   . Colon cancer Paternal Grandfather   . Kidney disease Neg Hx   . Prostate cancer Neg Hx    Social History:  Social History   Socioeconomic History  . Marital status: Married    Spouse name: JENNIFER  . Number of children: 2  . Years of education: None  . Highest education level: Master's degree (e.g., MA, MS, MEng, MEd, MSW, MBA)  Social Needs  . Financial resource  strain: Not hard at all  . Food insecurity - worry: Never true  . Food insecurity - inability: Never true  . Transportation needs - medical: No  . Transportation needs - non-medical: No  Occupational History    Comment: [employer]  Tobacco Use  . Smoking status: Never Smoker  . Smokeless tobacco: Never Used  Substance and Sexual Activity  . Alcohol use: Yes    Alcohol/week: 3.6 oz    Types: 6 Cans of beer per week    Comment: 2-3 times per week   . Drug use: No  . Sexual activity: Yes    Birth control/protection: None  Other Topics Concern  . None  Social History Narrative  . 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  General Appearance: Casual  Eye Contact:  Fair  Speech:  Clear and Coherent and Normal Rate  Volume:  Normal  Mood:  Euthymic  Affect:  Congruent  Thought  Process:  Coherent  Orientation:  Full (Time, Place, and Person)  Thought Content:  WDL  Suicidal Thoughts:  No  Homicidal Thoughts:  No  Memory:  Immediate;   Fair  Judgement:  Fair  Insight:  Fair  Psychomotor Activity:  Normal  Concentration:  Fair  Recall:  [employer]Fair  Fund of Knowledge: Fair  Language: Fair  Akathisia:  No  Handed:  Right  AIMS (if indicated):    Assets:  Communication Skills Desire for Improvement Physical Health Social Support Talents/Skills  ADL's:  Intact  Cognition: WNL  Sleep:     Is the patient at risk to self?  No. Has the patient been a risk to self in the past 6 months?  No. Has the patient been a risk to self within the distant past?  No. Is the  patient a risk to others?  No. Has the patient been a risk to others in the past 6 months?  No. Has the patient been a risk to others within the distant past?  No.  Current Medications: Current Outpatient Medications  Medication Sig Dispense Refill  . ARIPiprazole (ABILIFY) 2 MG tablet Take 1 tablet (2 mg total) by mouth daily. 90 tablet 2  . clonazePAM (KLONOPIN) 0.5 MG tablet Take 1 tablet (0.5 mg total) by mouth at bedtime. 30 tablet 2  . cyanocobalamin (V-R VITAMIN B-12) 500 MCG tablet Take 1,000 mcg by mouth.    Marland Kitchen. ibuprofen (ADVIL,MOTRIN) 800 MG tablet Take 1 tablet (800 mg total) by mouth every 8 (eight) hours as needed. 30 tablet 0  . lamoTRIgine (LAMICTAL) 200 MG tablet Take 1 tablet (200 mg total) by mouth every morning. 90 tablet 1   No current facility-administered medications for this visit.     Medical Decision Making:  Review of Psycho-Social Stressors (1) and Review of Last Therapy Session (1)  Treatment Plan Summary:Medication management      Mood symptoms Continue lamotrigine as prescribed. Abilify 2 mg by mouth daily. Klonopin 0.5 mg by mouth daily at bedtime for insomnia Discussed with him about the side effects of the medications and he agreed with the plan  He will follow up in 3 months or earlier.   More than 50% of the time spent in psychoeducation, counseling and coordination of care.    Time spent with the patient 25 minutes   This note was generated in part or whole with voice recognition software. Voice regonition is usually quite accurate but there are transcription errors that can and very often do occur. I apologize for any typographical errors that were not detected and corrected.    Brandy HaleUzma Jonna Dittrich 02/21/2017, 12:20 PM

## 2017-03-07 NOTE — Telephone Encounter (Signed)
ARIPiprazole (ABILIFY) 2 MG tablet  Medication  Date: 02/21/2017 Department: [facility] Ordering/Authorizing: Brandy HaleFaheem, Uzma, MD  Order Providers   Prescribing Provider Encounter Provider  Brandy HaleFaheem, Uzma, MD Brandy HaleFaheem, Uzma, MD  Medication Detail    Disp Refills Start End   ARIPiprazole (ABILIFY) 2 MG tablet 90 tablet 2 02/21/2017    Sig - Route: Take 1 tablet (2 mg total) by mouth daily. - Oral   Class: Print

## 2017-03-28 ENCOUNTER — Other Ambulatory Visit: Payer: Self-pay | Admitting: Psychiatry

## 2017-03-28 ENCOUNTER — Telehealth: Payer: Self-pay

## 2017-03-28 DIAGNOSIS — F3162 Bipolar disorder, current episode mixed, moderate: Secondary | ICD-10-CM

## 2017-03-28 MED ORDER — ARIPIPRAZOLE 2 MG PO TABS: 2.0000 mg | ORAL_TABLET | Freq: Every day | ORAL | 2 refills | Status: DC

## 2017-03-28 NOTE — Telephone Encounter (Signed)
Medication management - Message left for patient that his denied coverage for Abilify by [facility] did approve the generic form of Abilify, Aripiprazole and requested he fill it at that per Dr.Faheem and to call back if any further questions to get his prescribed medication filled.

## 2017-03-28 NOTE — Telephone Encounter (Signed)
Patient called back and said [facility] [address]

## 2017-03-28 NOTE — Telephone Encounter (Signed)
Done

## 2017-04-15 ENCOUNTER — Telehealth: Payer: Self-pay

## 2017-04-15 NOTE — Telephone Encounter (Signed)
wanted to know if something else was going to be sent in since pt abilify was denied by insurance.

## 2017-04-15 NOTE — Telephone Encounter (Signed)
Need to clarify with Dr.Faheem , since she sees him

## 2017-04-26 NOTE — Telephone Encounter (Signed)
Please advise 

## 2017-05-16 ENCOUNTER — Ambulatory Visit: Payer: BC Managed Care – PPO | Admitting: Psychiatry

## 2017-05-16 ENCOUNTER — Telehealth: Payer: Self-pay

## 2017-05-16 NOTE — Telephone Encounter (Signed)
pt abilify was approved by patients insurance. please advise.

## 2017-05-23 ENCOUNTER — Ambulatory Visit: Payer: BC Managed Care – PPO | Admitting: Psychiatry

## 2017-06-09 ENCOUNTER — Ambulatory Visit: Payer: BC Managed Care – PPO | Admitting: Psychiatry

## 2017-09-22 ENCOUNTER — Telehealth: Payer: Self-pay

## 2017-09-22 DIAGNOSIS — F3162 Bipolar disorder, current episode mixed, moderate: Secondary | ICD-10-CM

## 2017-09-22 MED ORDER — LAMOTRIGINE 200 MG PO TABS: 200.0000 mg | ORAL_TABLET | Freq: Every morning | ORAL | 0 refills | Status: DC

## 2017-09-22 MED ORDER — CLONAZEPAM 0.5 MG PO TABS: 0.5000 mg | ORAL_TABLET | Freq: Every evening | ORAL | 0 refills | Status: DC | PRN

## 2017-09-22 MED ORDER — ARIPIPRAZOLE 2 MG PO TABS: 2.0000 mg | ORAL_TABLET | Freq: Every day | ORAL | 0 refills | Status: DC

## 2017-09-22 NOTE — Telephone Encounter (Signed)
I have sent Abilify , lamictal and Klonopin to pharmacy. Pt has upcoming appointment in September to see Dr.Faheem.

## 2017-09-22 NOTE — Telephone Encounter (Signed)
pt called left message that he needed enough medication called in to do until his next appt with dr. Garnetta Buddyfaheem on  10-10-17.  lamictal, abilify, klonopin

## 2017-10-10 ENCOUNTER — Encounter: Payer: Self-pay | Admitting: Psychiatry

## 2017-10-10 ENCOUNTER — Ambulatory Visit: Payer: BC Managed Care – PPO | Admitting: Psychiatry

## 2017-10-10 ENCOUNTER — Other Ambulatory Visit: Payer: Self-pay

## 2017-10-10 VITALS — BP 126/81 | HR 78 | Temp 98.0°F | Wt 241.6 lb

## 2017-10-10 DIAGNOSIS — F3162 Bipolar disorder, current episode mixed, moderate: Secondary | ICD-10-CM

## 2017-10-10 MED ORDER — LAMOTRIGINE 200 MG PO TABS: 200.0000 mg | ORAL_TABLET | Freq: Every morning | ORAL | 1 refills | Status: DC

## 2017-10-10 MED ORDER — ARIPIPRAZOLE 2 MG PO TABS: 2.0000 mg | ORAL_TABLET | Freq: Every day | ORAL | 1 refills | Status: DC

## 2017-10-10 NOTE — Progress Notes (Signed)
BH MD/PA/NP OP Progress Note  10/10/2017 8:49 AM Jeffrey BeginSean Hopkins  MRN:  [id number]  Subjective:   Patient is a 47 year old married male who presented for the follow-up appointment . He was last seen in January.  He appeared tired this morning.  He reported that he has been doing well and was feeling tired over the weekend as he was helping his 255 year old daughter.  He stated that he has gained some weight with the help of Abilify but he does not want to change his medications.  Abilify has significantly improved his mood symptoms and he is currently doing well on his medications.  Patient reported that his mood is improved and he enjoys working at the school.  He has been feeling calm and relaxed.  He does not have any anger or anxiety or paranoia.  He has recently the filled his medications.  Patient sleeps well and does not take any sleeping aid at this time.  He has a stop taking the Klonopin.  He occasionally takes melatonin as he has changed his mattress and stated that it has helped him.   Patient denied having any suicidal homicidal ideations or plans.  He denied having any perceptual disturbances.  Appeared med compliant.  No impaired functioning noted at this time.     Chief Complaint:  Chief Complaint    Follow-up; Medication Refill     Visit Diagnosis:     ICD-10-CM   1. Bipolar disorder, current episode mixed, moderate (HCC) F31.62     Past Medical History:  Past Medical History:  Diagnosis Date  . Anxiety   . Bipolar disorder (HCC)   . Depression   . Kidney stone     Past Surgical History:  Procedure Laterality Date  . none     Family History:  Family History  Problem Relation Age of Onset  . Anxiety disorder Father   . Skin cancer Mother   . Allergies Sister   . Bipolar disorder Maternal Uncle   . Colon cancer Paternal Grandfather   . Kidney disease Neg Hx   . Prostate cancer Neg Hx    Social History:  Social History   Socioeconomic History  . Marital status:  Married    Spouse name: JENNIFER  . Number of children: 2  . Years of education: Not on file  . Highest education level: Master's degree (e.g., MA, MS, MEng, MEd, MSW, MBA)  Occupational History    Comment: [employer]  Social Needs  . Financial resource strain: Not hard at all  . Food insecurity:    Worry: Never true    Inability: Never true  . Transportation needs:    Medical: No    Non-medical: No  Tobacco Use  . Smoking status: Never Smoker  . Smokeless tobacco: Never Used  Substance and Sexual Activity  . Alcohol use: Yes    Alcohol/week: 6.0 standard drinks    Types: 6 Cans of beer per week    Comment: 2-3 times per week   . Drug use: No  . Sexual activity: Yes    Birth control/protection: None  Lifestyle  . Physical activity:    Days per week: 3 days    Minutes per session: 30 min  . Stress: To some extent  Relationships  . Social connections:    Talks on phone: Three times a week    Gets together: Twice a week    Attends religious service: More than 4 times per year    Active member of  club or organization: Yes    Attends meetings of clubs or organizations: More than 4 times per year    Relationship status: Married  Other Topics Concern  . Not on file  Social History Narrative  . 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  General Appearance: Casual  Eye Contact:  Fair  Speech:  Clear and Coherent and Normal Rate  Volume:  Normal  Mood:  Euthymic  Affect:  Congruent  Thought Process:  Coherent  Orientation:  Full (Time, Place, and  Person)  Thought Content:  WDL  Suicidal Thoughts:  No  Homicidal Thoughts:  No  Memory:  Immediate;   Fair  Judgement:  Fair  Insight:  Fair  Psychomotor Activity:  Normal  Concentration:  Fair  Recall:  [employer] of Knowledge: Fair  Language: Fair  Akathisia:  No  Handed:  Right  AIMS (if indicated):    Assets:  Communication Skills Desire for Improvement Physical Health Social Support Talents/Skills  ADL's:  Intact  Cognition: WNL  Sleep:     Is the patient at risk to self?  No. Has the patient been a risk to self in the past 6 months?  No. Has the patient been a risk to self within the distant past?  No. Is the patient a risk to others?  No. Has the patient been a risk to others in the past 6 months?  No. Has the patient been a risk to others within the distant past?  No.  Current Medications: Current Outpatient Medications  Medication Sig Dispense Refill  . ARIPiprazole (ABILIFY) 2 MG tablet Take 1 tablet (2 mg total) by mouth daily. 30 tablet 0  . clonazePAM (KLONOPIN) 0.5 MG tablet Take 1 tablet (0.5 mg total) by mouth at bedtime as needed for anxiety. 15 tablet 0  . cyanocobalamin (V-R VITAMIN B-12) 500 MCG tablet Take 1,000 mcg by mouth.    Marland Kitchen ibuprofen (ADVIL,MOTRIN) 800 MG tablet Take 1 tablet (800 mg total) by mouth every 8 (eight) hours as needed. 30 tablet 0  . lamoTRIgine (LAMICTAL) 200 MG tablet Take 1 tablet (200 mg total) by mouth every morning. 30 tablet 0   No current facility-administered medications for this visit.     Medical Decision Making:  Review of Psycho-Social Stressors (1) and Review of Last Therapy Session (1)  Treatment Plan Summary:Medication management      Mood symptoms Continue lamotrigine as prescribed. Abilify 2 mg by mouth daily.  Discussed with him about the side effects of the medications and he agreed with the plan  He will follow up in 4  months or earlier.   More than 50% of the time spent in psychoeducation,  counseling and coordination of care.       This note was generated in part or whole with voice recognition software. Voice regonition is usually quite accurate but there are transcription errors that can and very often do occur. I apologize for any typographical errors that were not detected and corrected.    Brandy Hale 10/10/2017,  8:49 AM

## 2017-10-24 ENCOUNTER — Other Ambulatory Visit: Payer: Self-pay | Admitting: Psychiatry

## 2017-10-24 DIAGNOSIS — F3162 Bipolar disorder, current episode mixed, moderate: Secondary | ICD-10-CM

## 2017-11-13 ENCOUNTER — Other Ambulatory Visit: Payer: Self-pay | Admitting: Psychiatry

## 2017-12-12 ENCOUNTER — Other Ambulatory Visit: Payer: Self-pay | Admitting: Psychiatry

## 2017-12-12 ENCOUNTER — Telehealth: Payer: Self-pay

## 2017-12-12 DIAGNOSIS — F3162 Bipolar disorder, current episode mixed, moderate: Secondary | ICD-10-CM

## 2017-12-12 MED ORDER — LAMOTRIGINE 200 MG PO TABS: 200.0000 mg | ORAL_TABLET | Freq: Every morning | ORAL | 1 refills | Status: DC

## 2017-12-12 MED ORDER — ARIPIPRAZOLE 2 MG PO TABS: 2.0000 mg | ORAL_TABLET | Freq: Every day | ORAL | 1 refills | Status: DC

## 2017-12-12 NOTE — Telephone Encounter (Signed)
Meds refilled.

## 2017-12-12 NOTE — Telephone Encounter (Signed)
pt called left message that he needs refill or enough medicatio to last until his next appt.

## 2018-02-06 ENCOUNTER — Telehealth: Payer: Self-pay | Admitting: Child and Adolescent Psychiatry

## 2018-02-06 ENCOUNTER — Encounter: Payer: Self-pay | Admitting: Psychiatry

## 2018-02-06 ENCOUNTER — Ambulatory Visit: Payer: BC Managed Care – PPO | Admitting: Psychiatry

## 2018-02-06 ENCOUNTER — Other Ambulatory Visit: Payer: Self-pay

## 2018-02-06 VITALS — BP 129/88 | HR 68 | Temp 98.0°F | Wt 234.4 lb

## 2018-02-06 DIAGNOSIS — F3162 Bipolar disorder, current episode mixed, moderate: Secondary | ICD-10-CM

## 2018-02-06 MED ORDER — LAMOTRIGINE 200 MG PO TABS: 200.0000 mg | ORAL_TABLET | Freq: Every morning | ORAL | 1 refills | Status: DC

## 2018-02-06 MED ORDER — CLONAZEPAM 0.5 MG PO TABS: 0.5000 mg | ORAL_TABLET | Freq: Every evening | ORAL | 2 refills | Status: DC | PRN

## 2018-02-06 MED ORDER — ARIPIPRAZOLE 2 MG PO TABS: 2.0000 mg | ORAL_TABLET | Freq: Every day | ORAL | 1 refills | Status: DC

## 2018-02-06 NOTE — Telephone Encounter (Signed)
This is Dr. Micki Riley patient and saw Dr. Garnetta Buddy today. Due to system glitch Dr. Garnetta Buddy unable to send rx of Klonopin to pt's pharmacy electronically and requested this writer to send the rx. Rx sent per Dr. Micki Riley request.

## 2018-02-06 NOTE — Progress Notes (Signed)
BH MD/PA/NP OP Progress Note  02/06/2018 8:56 AM Jeffrey Hopkins  MRN:  [id number]  Subjective:   Patient is a 48 year old married male who presented for the follow-up appointment . He reported that he has been doing well and has enjoyed the holidays with his family.  He reported that he did not drink  his 2 cups of coffee and he appeared tired this morning.  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  Patient reported that his wife remains supportive.  He has been compliant with his medications.  His blood pressure was within normal limits.  He reported that he has been helping his daughters who are both in high school.  Patient reported that he feels that the current combination of medication has been helping him.  He is not noticing any side effects of the medications.  He currently denied having any suicidal homicidal ideations or plans.  He appeared calm and alert.  He reported that he wants to take Klonopin on a as needed basis to help with the sleep.       No impaired functioning noted at this time.     Chief Complaint:  Chief Complaint    Follow-up; Medication Refill     Visit Diagnosis:     ICD-10-CM   1. Bipolar disorder, current episode mixed, moderate (HCC) F31.62     Past Medical History:  Past Medical History:  Diagnosis Date  . Anxiety   . Bipolar disorder (HCC)   . Depression   . Kidney stone     Past Surgical History:  Procedure Laterality Date  . none     Family History:  Family History  Problem Relation Age of Onset  . Anxiety disorder Father   . Skin cancer Mother   . Allergies Sister   . Bipolar disorder Maternal Uncle   . Colon cancer Paternal Grandfather   . Kidney disease Neg Hx   . Prostate cancer Neg Hx    Social History:  Social History   Socioeconomic History  . Marital status: Married    Spouse name: JENNIFER  . Number of children: 2  .  Years of education: Not on file  . Highest education level: Master's degree (e.g., MA, MS, MEng, MEd, MSW, MBA)  Occupational History    Comment: [employer]  Social Needs  . Financial resource strain: Not hard at all  . Food insecurity:    Worry: Never true    Inability: Never true  . Transportation needs:    Medical: No    Non-medical: No  Tobacco Use  . Smoking status: Never Smoker  . Smokeless tobacco: Never Used  Substance and Sexual Activity  . Alcohol use: Yes    Alcohol/week: 6.0 standard drinks    Types: 6 Cans of beer per week    Comment: 2-3 times per week   . Drug use: No  . Sexual activity: Yes    Birth control/protection: None  Lifestyle  . Physical activity:    Days per week: 3 days    Minutes per session: 30 min  . Stress: To some extent  Relationships  . Social connections:    Talks on phone: Three times a week    Gets together: Twice a week    Attends religious service: More than 4 times per year    Active member of club or organization: Yes  Attends meetings of clubs or organizations: More than 4 times per year    Relationship status: Married  Other Topics Concern  . Not on file  Social History Narrative  . 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  General Appearance: Casual  Eye Contact:  Fair  Speech:  Clear and Coherent and Normal Rate  Volume:  Normal  Mood:  Euthymic  Affect:  Congruent  Thought Process:  Coherent  Orientation:  Full (Time, Place, and Person)  Thought Content:  WDL  Suicidal Thoughts:  No  Homicidal  Thoughts:  No  Memory:  Immediate;   Fair  Judgement:  Fair  Insight:  Fair  Psychomotor Activity:  Normal  Concentration:  Fair  Recall:  [employer] of Knowledge: Fair  Language: Fair  Akathisia:  No  Handed:  Right  AIMS (if indicated):    Assets:  Communication Skills Desire for Improvement Physical Health Social Support Talents/Skills  ADL's:  Intact  Cognition: WNL  Sleep:     Is the patient at risk to self?  No. Has the patient been a risk to self in the past 6 months?  No. Has the patient been a risk to self within the distant past?  No. Is the patient a risk to others?  No. Has the patient been a risk to others in the past 6 months?  No. Has the patient been a risk to others within the distant past?  No.  Current Medications: Current Outpatient Medications  Medication Sig Dispense Refill  . ARIPiprazole (ABILIFY) 2 MG tablet Take 1 tablet (2 mg total) by mouth daily. 90 tablet 1  . cyanocobalamin (V-R VITAMIN B-12) 500 MCG tablet Take 1,000 mcg by mouth.    . lamoTRIgine (LAMICTAL) 200 MG tablet Take 1 tablet (200 mg total) by mouth every morning. 90 tablet 1   No current facility-administered medications for this visit.     Medical Decision Making:  Review of Psycho-Social Stressors (1) and Review of Last Therapy Session (1)  Treatment Plan Summary:Medication management      Mood symptoms Continue lamotrigine as prescribed. Abilify 2 mg by mouth daily.  Klonopin 0.5 mg p.o. nightly as needed with 2 refills. Discussed with him about the side effects of the medications and he agreed with the plan  He will follow up in 4  months or earlier.   More than 50% of the time spent in psychoeducation, counseling and coordination of care.       This note was generated in part or whole with voice recognition software. Voice regonition is usually quite accurate but there are transcription errors that can and very often do occur. I apologize for any typographical  errors that were not detected and corrected.    Brandy Hale 02/06/2018, 8:56 AM

## 2018-05-22 ENCOUNTER — Encounter: Payer: Self-pay | Admitting: Psychiatry

## 2018-05-22 ENCOUNTER — Ambulatory Visit (INDEPENDENT_AMBULATORY_CARE_PROVIDER_SITE_OTHER): Payer: BC Managed Care – PPO | Admitting: Psychiatry

## 2018-05-22 ENCOUNTER — Other Ambulatory Visit: Payer: Self-pay

## 2018-05-22 DIAGNOSIS — F3162 Bipolar disorder, current episode mixed, moderate: Secondary | ICD-10-CM

## 2018-05-22 MED ORDER — ARIPIPRAZOLE 2 MG PO TABS: 2.0000 mg | ORAL_TABLET | Freq: Every day | ORAL | 1 refills | Status: DC

## 2018-05-22 MED ORDER — LAMOTRIGINE 200 MG PO TABS: 200.0000 mg | ORAL_TABLET | Freq: Every morning | ORAL | 1 refills | Status: DC

## 2018-05-22 NOTE — Progress Notes (Signed)
Patient ID: Jeffrey Hopkins, male   DOB: 1970/05/26, 48 y.o.   MRN: [id number]   Patient is a 48 year old male with history of bipolar who was followed up for his medication refills. He reported that he has been doing well and is currently teaching high school classes online. He reported that he is living assignments to the students.we discussed about his job and the Covid situation in detail. Patient reported that he is cooking more at home and is doing more exercise on a regular basis. He currently denied having any side effects the medications. He takes Klonopin only on a PRN basis and has enough supply of the medication. He reported that his medications are helping him and he does not have any adverse effects of the medications. He is spending time with his family.  Plan. Patient will continue his medications as prescribed. Follow up in three months. Medications refilled.  I have discussed the assessment and treatment plan with the patient. The patient was provided an opportunity to ask questions and all were answered. The patient agreed with the plan and demonstrated an understanding of the instructions.   The patient was advised to call back or seek an in-person evaluation if the symptoms worsen or if the condition fails to improve as anticipated.   I provided 10 minutes of non-face-to-face time during this encounter.

## 2018-08-28 ENCOUNTER — Ambulatory Visit (INDEPENDENT_AMBULATORY_CARE_PROVIDER_SITE_OTHER): Payer: BC Managed Care – PPO | Admitting: Psychiatry

## 2018-08-28 ENCOUNTER — Encounter: Payer: Self-pay | Admitting: Psychiatry

## 2018-08-28 ENCOUNTER — Other Ambulatory Visit: Payer: Self-pay

## 2018-08-28 DIAGNOSIS — F39 Unspecified mood [affective] disorder: Secondary | ICD-10-CM

## 2018-08-28 DIAGNOSIS — F419 Anxiety disorder, unspecified: Secondary | ICD-10-CM | POA: Diagnosis not present

## 2018-08-28 DIAGNOSIS — F3162 Bipolar disorder, current episode mixed, moderate: Secondary | ICD-10-CM

## 2018-08-28 MED ORDER — ARIPIPRAZOLE 2 MG PO TABS: 2.0000 mg | ORAL_TABLET | Freq: Every day | ORAL | 1 refills | Status: DC

## 2018-08-28 MED ORDER — ZOLPIDEM TARTRATE 10 MG PO TABS: 10.0000 mg | ORAL_TABLET | Freq: Every evening | ORAL | 2 refills | Status: DC | PRN

## 2018-08-28 MED ORDER — LAMOTRIGINE 200 MG PO TABS: 200.0000 mg | ORAL_TABLET | Freq: Every morning | ORAL | 1 refills | Status: DC

## 2018-08-28 NOTE — Progress Notes (Signed)
Patient ID: Jeffrey Hopkins, male   DOB: April 25, 1970, 48 y.o.   MRN: [id number]   Patient is a 48 year old male with history of mood disorder and anxiety who was followed a medication management. He reported that he is Stable and has been compliant with his medications. 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 He has to take online classes and he stated that he is worried about the same. He reported that Klonopin is not helping him with sleep and he wants to try some other medication. He has tried Ativan in the past but it was not helpful. Patient reported that he would like to try some other medication. He stated that he spends most of the time at home. He has been taking his other medications as prescribed. We discussed about Ambien and he is agreeable with the plan. No other acute symptoms noted at this time.  Plan I will discontinue Klonopin. Start Ambien 10 mg have to one tablet at bedtime as needed for sleep. I have called the medication to the pharmacy. Continue other medications as prescribed. He will follow-up in two months earlier depending on his symptoms.   I connected with patient via telemedicine application and verified that I am speaking with the correct person using two identifiers.  I discussed the limitations of evaluation and management by telemedicine and the availability of in person appointments. The patient expressed understanding and agreed to proceed.   I discussed the assessment and treatment plan with the patient. The patient was provided an opportunity to ask questions and all were answered. The patient agreed with the plan and demonstrated an understanding of the instructions.   The patient was advised to call back or seek an in-person evaluation if the symptoms worsen or if the condition fails to improve as anticipated.   I provided 15 minutes of non-face-to-face time during this  encounter.

## 2018-10-09 ENCOUNTER — Ambulatory Visit: Payer: BC Managed Care – PPO | Admitting: Psychiatry

## 2018-10-16 ENCOUNTER — Encounter: Payer: Self-pay | Admitting: Psychiatry

## 2018-10-16 ENCOUNTER — Other Ambulatory Visit: Payer: Self-pay

## 2018-10-16 ENCOUNTER — Ambulatory Visit (INDEPENDENT_AMBULATORY_CARE_PROVIDER_SITE_OTHER): Payer: BC Managed Care – PPO | Admitting: Psychiatry

## 2018-10-16 DIAGNOSIS — F3162 Bipolar disorder, current episode mixed, moderate: Secondary | ICD-10-CM

## 2018-10-16 DIAGNOSIS — F349 Persistent mood [affective] disorder, unspecified: Secondary | ICD-10-CM | POA: Diagnosis not present

## 2018-10-16 MED ORDER — LAMOTRIGINE 200 MG PO TABS: 200.0000 mg | ORAL_TABLET | Freq: Every morning | ORAL | 1 refills | Status: DC

## 2018-10-16 MED ORDER — ARIPIPRAZOLE 2 MG PO TABS: 2.0000 mg | ORAL_TABLET | Freq: Every day | ORAL | 1 refills | Status: DC

## 2018-10-16 MED ORDER — ZOLPIDEM TARTRATE 10 MG PO TABS: 10.0000 mg | ORAL_TABLET | Freq: Every evening | ORAL | 2 refills | Status: DC | PRN

## 2018-10-16 NOTE — Progress Notes (Signed)
Patient ID: Jeffrey Hopkins, male   DOB: 1970-02-21, 48 y.o.   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 He reported that he has been busy with his job and has been doing well with Ambien and taking it on PRN basis when he gets stressed out and not able to sleep. He is only taking it one to two times per week. He reported that he feels much better on the current combination of medication and does not want to change his medications. We discussed about medication compliance. He stated that he has been busy with his work and has learned how to start using videoconferencing with the kids.  He remains pleasant and cooperative during the interview. No other acute symptoms noted at this time. He denied having any side effects of medications. He denied having any perceptual disturbances.  Plan I will refill his medications. I called in a prescription for Ambien 10 mg at bedtime to the local pharmacy with two refills. Refilled other medications. He will follow up in four months earlier depending on his symptoms.   I connected with patient via telemedicine application and verified that I am speaking with the correct person using two identifiers.  I discussed the limitations of evaluation and management by telemedicine and the availability of in person appointments. The patient expressed understanding and agreed to proceed.   I discussed the assessment and treatment plan with the patient. The patient was provided an opportunity to ask questions and all were answered. The patient agreed with the plan and demonstrated an understanding of the instructions.   The patient was advised to call back or seek an in-person evaluation if the symptoms worsen or if the condition fails to improve as anticipated.   I provided 15 minutes of non-face-to-face time during this encounter.

## 2019-02-12 ENCOUNTER — Ambulatory Visit (INDEPENDENT_AMBULATORY_CARE_PROVIDER_SITE_OTHER): Payer: BC Managed Care – PPO | Admitting: Psychiatry

## 2019-02-12 ENCOUNTER — Other Ambulatory Visit: Payer: Self-pay

## 2019-02-12 ENCOUNTER — Encounter: Payer: Self-pay | Admitting: Psychiatry

## 2019-02-12 DIAGNOSIS — F3170 Bipolar disorder, currently in remission, most recent episode unspecified: Secondary | ICD-10-CM

## 2019-02-12 DIAGNOSIS — F3162 Bipolar disorder, current episode mixed, moderate: Secondary | ICD-10-CM | POA: Diagnosis not present

## 2019-02-12 MED ORDER — LAMOTRIGINE 200 MG PO TABS: 200.0000 mg | ORAL_TABLET | Freq: Every morning | ORAL | 1 refills | Status: DC

## 2019-02-12 MED ORDER — ZOLPIDEM TARTRATE 10 MG PO TABS: 10.0000 mg | ORAL_TABLET | Freq: Every evening | ORAL | 2 refills | Status: DC | PRN

## 2019-02-12 MED ORDER — ARIPIPRAZOLE 2 MG PO TABS: 2.0000 mg | ORAL_TABLET | Freq: Every day | ORAL | 1 refills | Status: DC

## 2019-02-12 NOTE — Progress Notes (Signed)
[facility] MD OP Progress Note  I connected with  Jeffrey Hopkins on 02/12/19 by a video enabled telemedicine application and verified that I am speaking with the correct person using two identifiers.   I discussed the limitations of evaluation and management by telemedicine. The patient expressed understanding and agreed to proceed.    02/12/2019 9:59 AM Jeffrey Hopkins  MRN:  [id number]  Chief Complaint: :" I am doing very well."  HPI: Patient reported that he continues to do well on his current medication regimen.  He stated his mood has been stable.  He needed he feels fortunate to have had a long duration of stability.  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  Overall things are going well at personal and professional level.  He informed that he uses Ambien as and when needed.  He informed that he needs it more frequently on some occasions compared to others.   Visit Diagnosis:    ICD-10-CM   1. Bipolar disorder in full remission, most recent episode unspecified type (HCC)  F31.70     Past Psychiatric History: Bipolar disorder  Past Medical History:  Past Medical History:  Diagnosis Date  . Anxiety   . Bipolar disorder (HCC)   . Depression   . Kidney stone     Past Surgical History:  Procedure Laterality Date  . none      Family Psychiatric History: see below  Family History:  Family History  Problem Relation Age of Onset  . Anxiety disorder Father   . Skin cancer Mother   . Allergies Sister   . Bipolar disorder Maternal Uncle   . Colon cancer Paternal Grandfather   . Kidney disease Neg Hx   . Prostate cancer Neg Hx     Social History:  Social History   Socioeconomic History  . Marital status: Married    Spouse name: JENNIFER  . Number of children: 2  . Years of education: Not on file  . Highest education level: Master's degree (e.g., MA, MS, MEng, MEd, MSW, MBA)  Occupational History    Comment: FULLTIME  Tobacco Use   . Smoking status: Never Smoker  . Smokeless tobacco: Never Used  Substance and Sexual Activity  . Alcohol use: Yes    Alcohol/week: 6.0 standard drinks    Types: 6 Cans of beer per week    Comment: 2-3 times per week   . Drug use: No  . Sexual activity: Yes    Birth control/protection: None  Other Topics Concern  . Not on file  Social History Narrative  . Not on file   Social Determinants of Health   Financial Resource Strain:   . Difficulty of Paying Living Expenses: Not on file  Food Insecurity:   . Worried About Programme researcher, broadcasting/film/video in the Last Year: Not on file  . Ran Out of Food in the Last Year: Not on file  Transportation Needs:   . Lack of Transportation (Medical): Not on file  . Lack of Transportation (Non-Medical): Not on file  Physical Activity:   . Days of Exercise per Week: Not on file  . Minutes of Exercise per Session: Not on file  Stress:   . Feeling of Stress : Not on file  Social Connections:   . Frequency of Communication with Friends and Family: Not on file  . Frequency of Social Gatherings with Friends and Family: Not on file  . Attends  Religious Services: Not on file  . Active Member of Clubs or Organizations: Not on file  . Attends Archivist Meetings: Not on file  . Marital Status: Not on file    Allergies:  Allergies  Allergen Reactions  . Vicodin [Hydrocodone-Acetaminophen] Nausea And Vomiting    Metabolic Disorder Labs: No results found for: HGBA1C, MPG No results found for: PROLACTIN No results found for: CHOL, TRIG, HDL, CHOLHDL, VLDL, LDLCALC No results found for: TSH  Therapeutic Level Labs: No results found for: LITHIUM No results found for: VALPROATE No components found for:  CBMZ  Current Medications: Current Outpatient Medications  Medication Sig Dispense Refill  . ARIPiprazole (ABILIFY) 2 MG tablet Take 1 tablet (2 mg total) by mouth daily. 90 tablet 1  . cyanocobalamin (V-R VITAMIN B-12) 500 MCG tablet Take  1,000 mcg by mouth.    . lamoTRIgine (LAMICTAL) 200 MG tablet Take 1 tablet (200 mg total) by mouth every morning. 90 tablet 1  . zolpidem (AMBIEN) 10 MG tablet Take 1 tablet (10 mg total) by mouth at bedtime as needed for sleep. Take 1/2- 1 tab as needed for sleep 30 tablet 2   No current facility-administered medications for this visit.     Musculoskeletal: Strength & Muscle Tone: unable to assess due to telemed visit Gait & Station: unable to assess due to telemed visit Patient leans: unable to assess due to telemed visit  Psychiatric Specialty Exam: Review of Systems  There were no vitals taken for this visit.There is no height or weight on file to calculate BMI.  General Appearance: Fairly Groomed  Eye Contact:  Good  Speech:  Clear and Coherent and Normal Rate  Volume:  Normal  Mood:  Euthymic  Affect:  Congruent  Thought Process:  Goal Directed, Linear and Descriptions of Associations: Intact  Orientation:  Full (Time, Place, and Person)  Thought Content: Logical   Suicidal Thoughts:  No  Homicidal Thoughts:  No  Memory:  Recent;   Good Remote;   Good  Judgement:  Good  Insight:  Good  Psychomotor Activity:  Normal  Concentration:  Concentration: Good and Attention Span: Good  Recall:  Good  Fund of Knowledge: Good  Language: Negative  Akathisia:  Negative  Handed:  Right  AIMS (if indicated): not done  Assets:  Communication Skills Desire for Improvement Financial Resources/Insurance Housing Social Support  ADL's:  Intact  Cognition: WNL  Sleep:  Good    Assessment and Plan: Patient reported doing well on his current medication regimen.  He denies any acute stressors or concerns.  1. Bipolar disorder in full remission, most recent episode unspecified type ([place])  - ARIPiprazole (ABILIFY) 2 MG tablet; Take 1 tablet (2 mg total) by mouth daily.  Dispense: 90 tablet; Refill: 1 - lamoTRIgine (LAMICTAL) 200 MG tablet; Take 1 tablet (200 mg total) by mouth every  morning.  Dispense: 90 tablet; Refill: 1 - zolpidem (AMBIEN) 10 MG tablet; Take 1 tablet (10 mg total) by mouth at bedtime as needed for sleep. Take 1/2- 1 tab as needed for sleep  Dispense: 30 tablet; Refill: 2  Continue same medication regimen. Follow up in 3 months.    Nevada Crane, MD 02/12/2019, 9:59 AM

## 2019-04-12 ENCOUNTER — Other Ambulatory Visit: Payer: Self-pay | Admitting: Psychiatry

## 2019-04-12 DIAGNOSIS — F3170 Bipolar disorder, currently in remission, most recent episode unspecified: Secondary | ICD-10-CM

## 2019-05-07 ENCOUNTER — Encounter: Payer: Self-pay | Admitting: Psychiatry

## 2019-05-07 ENCOUNTER — Ambulatory Visit (INDEPENDENT_AMBULATORY_CARE_PROVIDER_SITE_OTHER): Payer: BC Managed Care – PPO | Admitting: Psychiatry

## 2019-05-07 ENCOUNTER — Other Ambulatory Visit: Payer: Self-pay

## 2019-05-07 DIAGNOSIS — F3170 Bipolar disorder, currently in remission, most recent episode unspecified: Secondary | ICD-10-CM | POA: Diagnosis not present

## 2019-05-07 MED ORDER — ZOLPIDEM TARTRATE 10 MG PO TABS: 10.0000 mg | ORAL_TABLET | Freq: Every evening | ORAL | 2 refills | Status: DC | PRN

## 2019-05-07 MED ORDER — ARIPIPRAZOLE 2 MG PO TABS: 2.0000 mg | ORAL_TABLET | Freq: Every day | ORAL | 1 refills | Status: DC

## 2019-05-07 MED ORDER — LAMOTRIGINE 200 MG PO TABS: 200.0000 mg | ORAL_TABLET | Freq: Every day | ORAL | 1 refills | Status: DC

## 2019-05-07 NOTE — Progress Notes (Signed)
[facility] MD OP Progress Note  I connected with  Jeffrey Hopkins on 05/07/19 by a video enabled telemedicine application and verified that I am speaking with the correct person using two identifiers.   I discussed the limitations of evaluation and management by telemedicine. The patient expressed understanding and agreed to proceed.    05/07/2019 3:44 PM Jeffrey Hopkins  MRN:  [id number]  Chief Complaint: :" I am doing very well."  HPI: Patient reported that everything is going well for him.  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  He denied any acute issues or concerns.  He said his personal and professional life are progressing well.  Visit Diagnosis:    ICD-10-CM   1. Bipolar disorder in full remission, most recent episode unspecified type ([place])  F31.70     Past Psychiatric History: Bipolar disorder  Past Medical History:  Past Medical History:  Diagnosis Date  . Anxiety   . Bipolar disorder ([place])   . Depression   . Kidney stone     Past Surgical History:  Procedure Laterality Date  . none      Family Psychiatric History: see below  Family History:  Family History  Problem Relation Age of Onset  . Anxiety disorder Father   . Skin cancer Mother   . Allergies Sister   . Bipolar disorder Maternal Uncle   . Colon cancer Paternal Grandfather   . Kidney disease Neg Hx   . Prostate cancer Neg Hx     Social History:  Social History   Socioeconomic History  . Marital status: Married    Spouse name: JENNIFER  . Number of children: 2  . Years of education: Not on file  . Highest education level: Master's degree (e.g., MA, MS, MEng, MEd, MSW, MBA)  Occupational History    Comment: FULLTIME  Tobacco Use  . Smoking status: Never Smoker  . Smokeless tobacco: Never Used  Substance and Sexual Activity  . Alcohol use: Yes    Alcohol/week: 6.0 standard drinks    Types: 6 Cans of beer per week    Comment: 2-3  times per week   . Drug use: No  . Sexual activity: Yes    Birth control/protection: None  Other Topics Concern  . Not on file  Social History Narrative  . Not on file   Social Determinants of Health   Financial Resource Strain:   . Difficulty of Paying Living Expenses:   Food Insecurity:   . Worried About Charity fundraiser in the Last Year:   . Arboriculturist in the Last Year:   Transportation Needs:   . Film/video editor (Medical):   Marland Kitchen Lack of Transportation (Non-Medical):   Physical Activity:   . Days of Exercise per Week:   . Minutes of Exercise per Session:   Stress:   . Feeling of Stress :   Social Connections:   . Frequency of Communication with Friends and Family:   . Frequency of Social Gatherings with Friends and Family:   . Attends Religious Services:   . Active Member of Clubs or Organizations:   . Attends Archivist Meetings:   Marland Kitchen Marital Status:     Allergies:  Allergies  Allergen Reactions  . Vicodin [Hydrocodone-Acetaminophen] Nausea And Vomiting    Metabolic Disorder Labs: No results found for: HGBA1C, MPG No results found for: PROLACTIN No results found for: CHOL, TRIG,  HDL, CHOLHDL, VLDL, LDLCALC No results found for: TSH  Therapeutic Level Labs: No results found for: LITHIUM No results found for: VALPROATE No components found for:  CBMZ  Current Medications: Current Outpatient Medications  Medication Sig Dispense Refill  . ARIPiprazole (ABILIFY) 2 MG tablet Take 1 tablet (2 mg total) by mouth daily. 90 tablet 1  . cyanocobalamin (V-R VITAMIN B-12) 500 MCG tablet Take 1,000 mcg by mouth.    . lamoTRIgine (LAMICTAL) 200 MG tablet Take 1 tablet (200 mg total) by mouth every morning. 90 tablet 1  . zolpidem (AMBIEN) 10 MG tablet Take 1 tablet (10 mg total) by mouth at bedtime as needed for sleep. Take 1/2- 1 tab as needed for sleep 30 tablet 2   No current facility-administered medications for this visit.      Musculoskeletal: Strength & Muscle Tone: unable to assess due to telemed visit Gait & Station: unable to assess due to telemed visit Patient leans: unable to assess due to telemed visit  Psychiatric Specialty Exam: Review of Systems  There were no vitals taken for this visit.There is no height or weight on file to calculate BMI.  General Appearance: Fairly Groomed  Eye Contact:  Good  Speech:  Clear and Coherent and Normal Rate  Volume:  Normal  Mood:  Euthymic  Affect:  Congruent  Thought Process:  Goal Directed, Linear and Descriptions of Associations: Intact  Orientation:  Full (Time, Place, and Person)  Thought Content: Logical   Suicidal Thoughts:  No  Homicidal Thoughts:  No  Memory:  Recent;   Good Remote;   Good  Judgement:  Good  Insight:  Good  Psychomotor Activity:  Normal  Concentration:  Concentration: Good and Attention Span: Good  Recall:  Good  Fund of Knowledge: Good  Language: Negative  Akathisia:  Negative  Handed:  Right  AIMS (if indicated): not done  Assets:  Communication Skills Desire for Improvement Financial Resources/Insurance Housing Social Support  ADL's:  Intact  Cognition: WNL  Sleep:  Good    Assessment and Plan: Patient reported doing well on his current medication regimen.  He denies any acute stressors or concerns.  1. Bipolar disorder in full remission, most recent episode unspecified type (HCC)  - ARIPiprazole (ABILIFY) 2 MG tablet; Take 1 tablet (2 mg total) by mouth daily.  Dispense: 90 tablet; Refill: 1 - lamoTRIgine (LAMICTAL) 200 MG tablet; Take 1 tablet (200 mg total) by mouth every morning.  Dispense: 90 tablet; Refill: 1 - zolpidem (AMBIEN) 10 MG tablet; Take 1 tablet (10 mg total) by mouth at bedtime as needed for sleep. Take 1/2- 1 tab as needed for sleep  Dispense: 30 tablet; Refill: 2  Continue same medication regimen. Follow up in 3 months.    Zena Amos, MD 05/07/2019, 3:44 PM

## 2019-08-06 ENCOUNTER — Ambulatory Visit: Payer: BC Managed Care – PPO | Admitting: Psychiatry

## 2019-08-06 ENCOUNTER — Encounter (HOSPITAL_COMMUNITY): Payer: Self-pay | Admitting: Psychiatry

## 2019-08-06 ENCOUNTER — Other Ambulatory Visit: Payer: Self-pay

## 2019-08-06 ENCOUNTER — Telehealth (INDEPENDENT_AMBULATORY_CARE_PROVIDER_SITE_OTHER): Payer: BC Managed Care – PPO | Admitting: Psychiatry

## 2019-08-06 DIAGNOSIS — F3170 Bipolar disorder, currently in remission, most recent episode unspecified: Secondary | ICD-10-CM | POA: Diagnosis not present

## 2019-08-06 MED ORDER — ZOLPIDEM TARTRATE 10 MG PO TABS: 10.0000 mg | ORAL_TABLET | Freq: Every evening | ORAL | 2 refills | Status: DC | PRN

## 2019-08-06 MED ORDER — LAMOTRIGINE 200 MG PO TABS: 200.0000 mg | ORAL_TABLET | Freq: Every day | ORAL | 1 refills | Status: DC

## 2019-08-06 MED ORDER — ARIPIPRAZOLE 2 MG PO TABS: 2.0000 mg | ORAL_TABLET | Freq: Every day | ORAL | 1 refills | Status: DC

## 2019-08-06 NOTE — Progress Notes (Signed)
[facility] MD OP Progress Note  I Virtual Visit via Video Note  I connected with Jeffrey Hopkins on 08/06/19 at  3:30 PM EDT by a video enabled telemedicine application and verified that I am speaking with the correct person using two identifiers.  Location: Patient: Home Provider: Clinic   I discussed the limitations of evaluation and management by telemedicine and the availability of in person appointments. The patient expressed understanding and agreed to proceed.  I provided 14 minutes of non-face-to-face time during this encounter.    08/06/2019 3:27 PM Jeffrey Hopkins  MRN:  [id number]  Chief Complaint: " Everything is going well."  HPI: Patient informed that things going well.  He informed that his daughter is going to college in August to [employer] and as result the family has been busy information for that.  He stated that he started taking Abilify Lamictal at bedtime and ever since then he has been sleeping better and he rarely needs to use his Ambien now.  He denied any acute issues or concerns pertaining to his mood at this point.   Visit Diagnosis:    ICD-10-CM   1. Bipolar disorder in full remission, most recent episode unspecified type (HCC)  F31.70     Past Psychiatric History: Bipolar disorder  Past Medical History:  Past Medical History:  Diagnosis Date  . Anxiety   . Bipolar disorder (HCC)   . Depression   . Kidney stone     Past Surgical History:  Procedure Laterality Date  . none      Family Psychiatric History: see below  Family History:  Family History  Problem Relation Age of Onset  . Anxiety disorder Father   . Skin cancer Mother   . Allergies Sister   . Bipolar disorder Maternal Uncle   . Colon cancer Paternal Grandfather   . Kidney disease Neg Hx   . Prostate cancer Neg Hx     Social History:  Social History   Socioeconomic History  . Marital status: Married    Spouse name: Jeffrey Hopkins  . Number of children: 2  . Years of education: Not on file  .  Highest education level: Master's degree (e.g., MA, MS, MEng, MEd, MSW, MBA)  Occupational History    Comment: FULLTIME  Tobacco Use  . Smoking status: Never Smoker  . Smokeless tobacco: Never Used  Vaping Use  . Vaping Use: Never used  Substance and Sexual Activity  . Alcohol use: Yes    Alcohol/week: 6.0 standard drinks    Types: 6 Cans of beer per week    Comment: 2-3 times per week   . Drug use: No  . Sexual activity: Yes    Birth control/protection: None  Other Topics Concern  . Not on file  Social History Narrative  . Not on file   Social Determinants of Health   Financial Resource Strain:   . Difficulty of Paying Living Expenses:   Food Insecurity:   . Worried About Programme researcher, broadcasting/film/video in the Last Year:   . Barista in the Last Year:   Transportation Needs:   . Freight forwarder (Medical):   Marland Kitchen Lack of Transportation (Non-Medical):   Physical Activity:   . Days of Exercise per Week:   . Minutes of Exercise per Session:   Stress:   . Feeling of Stress :   Social Connections:   . Frequency of Communication with Friends and Family:   . Frequency of Social Gatherings with  Friends and Family:   . Attends Religious Services:   . Active Member of Clubs or Organizations:   . Attends Banker Meetings:   Marland Kitchen Marital Status:     Allergies:  Allergies  Allergen Reactions  . Vicodin [Hydrocodone-Acetaminophen] Nausea And Vomiting    Metabolic Disorder Labs: No results found for: HGBA1C, MPG No results found for: PROLACTIN No results found for: CHOL, TRIG, HDL, CHOLHDL, VLDL, LDLCALC No results found for: TSH  Therapeutic Level Labs: No results found for: LITHIUM No results found for: VALPROATE No components found for:  CBMZ  Current Medications: Current Outpatient Medications  Medication Sig Dispense Refill  . ARIPiprazole (ABILIFY) 2 MG tablet Take 1 tablet (2 mg total) by mouth daily. 90 tablet 1  . cyanocobalamin (V-R VITAMIN B-12)  500 MCG tablet Take 1,000 mcg by mouth.    . lamoTRIgine (LAMICTAL) 200 MG tablet Take 1 tablet (200 mg total) by mouth daily. 90 tablet 1  . zolpidem (AMBIEN) 10 MG tablet Take 1 tablet (10 mg total) by mouth at bedtime as needed for sleep. Take 1/2- 1 tab as needed for sleep 30 tablet 2   No current facility-administered medications for this visit.     Musculoskeletal: Strength & Muscle Tone: unable to assess due to telemed visit Gait & Station: unable to assess due to telemed visit Patient leans: unable to assess due to telemed visit  Psychiatric Specialty Exam: Review of Systems  There were no vitals taken for this visit.There is no height or weight on file to calculate BMI.  General Appearance: Fairly Groomed  Eye Contact:  Good  Speech:  Clear and Coherent and Normal Rate  Volume:  Normal  Mood:  Euthymic  Affect:  Congruent  Thought Process:  Goal Directed, Linear and Descriptions of Associations: Intact  Orientation:  Full (Time, Place, and Person)  Thought Content: Logical   Suicidal Thoughts:  No  Homicidal Thoughts:  No  Memory:  Recent;   Good Remote;   Good  Judgement:  Good  Insight:  Good  Psychomotor Activity:  Normal  Concentration:  Concentration: Good and Attention Span: Good  Recall:  Good  Fund of Knowledge: Good  Language: Negative  Akathisia:  Negative  Handed:  Right  AIMS (if indicated): not done  Assets:  Communication Skills Desire for Improvement Financial Resources/Insurance Housing Social Support  ADL's:  Intact  Cognition: WNL  Sleep:  Good    Assessment and Plan: Patient reported doing well on his current medication regimen.    1. Bipolar disorder in full remission, most recent episode unspecified type (HCC)  - ARIPiprazole (ABILIFY) 2 MG tablet; Take 1 tablet (2 mg total) by mouth daily.  Dispense: 90 tablet; Refill: 1 - lamoTRIgine (LAMICTAL) 200 MG tablet; Take 1 tablet (200 mg total) by mouth every morning.  Dispense: 90  tablet; Refill: 1 - zolpidem (AMBIEN) 10 MG tablet; Take 1 tablet (10 mg total) by mouth at bedtime as needed for sleep. Take 1/2- 1 tab as needed for sleep  Dispense: 30 tablet; Refill: 2  Continue same medication regimen. Follow up in 4 months.    Zena Amos, MD 08/06/2019, 3:27 PM

## 2019-11-28 ENCOUNTER — Encounter (HOSPITAL_COMMUNITY): Payer: Self-pay | Admitting: Psychiatry

## 2019-11-28 ENCOUNTER — Telehealth (INDEPENDENT_AMBULATORY_CARE_PROVIDER_SITE_OTHER): Payer: BC Managed Care – PPO | Admitting: Psychiatry

## 2019-11-28 ENCOUNTER — Other Ambulatory Visit: Payer: Self-pay

## 2019-11-28 DIAGNOSIS — F3170 Bipolar disorder, currently in remission, most recent episode unspecified: Secondary | ICD-10-CM | POA: Diagnosis not present

## 2019-11-28 MED ORDER — LAMOTRIGINE 200 MG PO TABS: 200.0000 mg | ORAL_TABLET | Freq: Every day | ORAL | 1 refills | Status: DC

## 2019-11-28 MED ORDER — ARIPIPRAZOLE 2 MG PO TABS: 2.0000 mg | ORAL_TABLET | Freq: Every day | ORAL | 1 refills | Status: DC

## 2019-11-28 MED ORDER — ZOLPIDEM TARTRATE 10 MG PO TABS: 10.0000 mg | ORAL_TABLET | Freq: Every evening | ORAL | 2 refills | Status: DC | PRN

## 2019-11-28 NOTE — Progress Notes (Signed)
BH MD OP Progress Note  I Virtual Visit via Video Note  I connected with Jeffrey Hopkins on 11/28/19 at  3:30 PM EDT by a video enabled telemedicine application and verified that I am speaking with the correct person using two identifiers.  Location: Patient: Work Provider: Clinic   I discussed the limitations of evaluation and management by telemedicine and the availability of in person appointments. The patient expressed understanding and agreed to proceed.  I provided 15 minutes of non-face-to-face time during this encounter.    11/28/2019 2:57 PM Jeffrey Hopkins  MRN:  [id number]  Chief Complaint: " I am doing well, everything is going well."  HPI: Patient followed he is doing well.  He informed that his medicines are working well and is taking them regularly.  He denied any issues or concerns at this time.  He mentioned his daughter who started college this fall is also doing well and he is going to see her tonight.  He requested refills for the same medications.   Visit Diagnosis:    ICD-10-CM   1. Bipolar disorder in full remission, most recent episode unspecified type (HCC)  F31.70     Past Psychiatric History: Bipolar disorder  Past Medical History:  Past Medical History:  Diagnosis Date  . Anxiety   . Bipolar disorder (HCC)   . Depression   . Kidney stone     Past Surgical History:  Procedure Laterality Date  . none      Family Psychiatric History: see below  Family History:  Family History  Problem Relation Age of Onset  . Anxiety disorder Father   . Skin cancer Mother   . Allergies Sister   . Bipolar disorder Maternal Uncle   . Colon cancer Paternal Grandfather   . Kidney disease Neg Hx   . Prostate cancer Neg Hx     Social History:  Social History   Socioeconomic History  . Marital status: Married    Spouse name: JENNIFER  . Number of children: 2  . Years of education: Not on file  . Highest education level: Master's degree (e.g., MA, MS, MEng, MEd,  MSW, MBA)  Occupational History    Comment: FULLTIME  Tobacco Use  . Smoking status: Never Smoker  . Smokeless tobacco: Never Used  Vaping Use  . Vaping Use: Never used  Substance and Sexual Activity  . Alcohol use: Yes    Alcohol/week: 6.0 standard drinks    Types: 6 Cans of beer per week    Comment: 2-3 times per week   . Drug use: No  . Sexual activity: Yes    Birth control/protection: None  Other Topics Concern  . Not on file  Social History Narrative  . Not on file   Social Determinants of Health   Financial Resource Strain:   . Difficulty of Paying Living Expenses: Not on file  Food Insecurity:   . Worried About Programme researcher, broadcasting/film/video in the Last Year: Not on file  . Ran Out of Food in the Last Year: Not on file  Transportation Needs:   . Lack of Transportation (Medical): Not on file  . Lack of Transportation (Non-Medical): Not on file  Physical Activity:   . Days of Exercise per Week: Not on file  . Minutes of Exercise per Session: Not on file  Stress:   . Feeling of Stress : Not on file  Social Connections:   . Frequency of Communication with Friends and Family: Not on file  .  Frequency of Social Gatherings with Friends and Family: Not on file  . Attends Religious Services: Not on file  . Active Member of Clubs or Organizations: Not on file  . Attends Banker Meetings: Not on file  . Marital Status: Not on file    Allergies:  Allergies  Allergen Reactions  . Vicodin [Hydrocodone-Acetaminophen] Nausea And Vomiting    Metabolic Disorder Labs: No results found for: HGBA1C, MPG No results found for: PROLACTIN No results found for: CHOL, TRIG, HDL, CHOLHDL, VLDL, LDLCALC No results found for: TSH  Therapeutic Level Labs: No results found for: LITHIUM No results found for: VALPROATE No components found for:  CBMZ  Current Medications: Current Outpatient Medications  Medication Sig Dispense Refill  . ARIPiprazole (ABILIFY) 2 MG tablet Take 1  tablet (2 mg total) by mouth daily. 90 tablet 1  . cyanocobalamin (V-R VITAMIN B-12) 500 MCG tablet Take 1,000 mcg by mouth.    . lamoTRIgine (LAMICTAL) 200 MG tablet Take 1 tablet (200 mg total) by mouth daily. 90 tablet 1  . zolpidem (AMBIEN) 10 MG tablet Take 1 tablet (10 mg total) by mouth at bedtime as needed for sleep. Take 1/2- 1 tab as needed for sleep 30 tablet 2   No current facility-administered medications for this visit.      Psychiatric Specialty Exam: Review of Systems  There were no vitals taken for this visit.There is no height or weight on file to calculate BMI.  General Appearance: Fairly Groomed  Eye Contact:  Good  Speech:  Clear and Coherent and Normal Rate  Volume:  Normal  Mood:  Euthymic  Affect:  Congruent  Thought Process:  Goal Directed, Linear and Descriptions of Associations: Intact  Orientation:  Full (Time, Place, and Person)  Thought Content: Logical   Suicidal Thoughts:  No  Homicidal Thoughts:  No  Memory:  Recent;   Good Remote;   Good  Judgement:  Good  Insight:  Good  Psychomotor Activity:  Normal  Concentration:  Concentration: Good and Attention Span: Good  Recall:  Good  Fund of Knowledge: Good  Language: Negative  Akathisia:  Negative  Handed:  Right  AIMS (if indicated): not done  Assets:  Communication Skills Desire for Improvement Financial Resources/Insurance Housing Social Support  ADL's:  Intact  Cognition: WNL  Sleep:  Good    Assessment and Plan: Patient appears to be stable on his current medications.  1. Bipolar disorder in full remission, most recent episode unspecified type (HCC)  - ARIPiprazole (ABILIFY) 2 MG tablet; Take 1 tablet (2 mg total) by mouth daily.  Dispense: 90 tablet; Refill: 1 - lamoTRIgine (LAMICTAL) 200 MG tablet; Take 1 tablet (200 mg total) by mouth every morning.  Dispense: 90 tablet; Refill: 1 - zolpidem (AMBIEN) 10 MG tablet; Take 1 tablet (10 mg total) by mouth at bedtime as needed for  sleep. Take 1/2- 1 tab as needed for sleep  Dispense: 30 tablet; Refill: 2  Continue same medication regimen. Follow up in 4 months.    Zena Amos, MD 11/28/2019, 2:57 PM

## 2020-03-20 ENCOUNTER — Other Ambulatory Visit: Payer: Self-pay

## 2020-03-20 ENCOUNTER — Encounter (HOSPITAL_COMMUNITY): Payer: Self-pay | Admitting: Psychiatry

## 2020-03-20 ENCOUNTER — Telehealth (INDEPENDENT_AMBULATORY_CARE_PROVIDER_SITE_OTHER): Payer: BC Managed Care – PPO | Admitting: Psychiatry

## 2020-03-20 DIAGNOSIS — F3170 Bipolar disorder, currently in remission, most recent episode unspecified: Secondary | ICD-10-CM | POA: Diagnosis not present

## 2020-03-20 MED ORDER — ZOLPIDEM TARTRATE 10 MG PO TABS: 10.0000 mg | ORAL_TABLET | Freq: Every evening | ORAL | 2 refills | Status: DC | PRN

## 2020-03-20 MED ORDER — LAMOTRIGINE 200 MG PO TABS: 200.0000 mg | ORAL_TABLET | Freq: Every day | ORAL | 1 refills | Status: DC

## 2020-03-20 MED ORDER — ARIPIPRAZOLE 2 MG PO TABS: 2.0000 mg | ORAL_TABLET | Freq: Every day | ORAL | 1 refills | Status: DC

## 2020-03-20 NOTE — Progress Notes (Signed)
BH MD OP Progress Note  Virtual Visit via Video Note  I connected with Jeffrey Hopkins on 03/20/20 at  3:40 PM EST by a video enabled telemedicine application and verified that I am speaking with the correct person using two identifiers.  Location: Patient: Work/school Provider: Clinic   I discussed the limitations of evaluation and management by telemedicine and the availability of in person appointments. The patient expressed understanding and agreed to proceed.  I provided 14 minutes of non-face-to-face time during this encounter.     03/20/2020 3:35 PM Jeffrey Hopkins  MRN:  [id number]  Chief Complaint: " Everything is going well."  HPI: Patient seen at work.  He informed that everything is going well.  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  This has kind of brought back some of the normalcy.  He feels that everyone is more relaxed in school now. He informed that everything is going well for him in his personal life as well.  He is looking forward to spring so that he can go [place]. He denied any other concerns at this time and reported all his medicines are working well.  Visit Diagnosis:    ICD-10-CM   1. Bipolar disorder in full remission, most recent episode unspecified type (HCC)  F31.70     Past Psychiatric History: Bipolar disorder  Past Medical History:  Past Medical History:  Diagnosis Date  . Anxiety   . Bipolar disorder (HCC)   . Depression   . Kidney stone     Past Surgical History:  Procedure Laterality Date  . none      Family Psychiatric History: see below  Family History:  Family History  Problem Relation Age of Onset  . Anxiety disorder Father   . Skin cancer Mother   . Allergies Sister   . Bipolar disorder Maternal Uncle   . Colon cancer Paternal Grandfather   . Kidney disease Neg Hx   .  Prostate cancer Neg Hx     Social History:  Social History   Socioeconomic History  . Marital status: Married    Spouse name: JENNIFER  . Number of children: 2  . Years of education: Not on file  . Highest education level: Master's degree (e.g., MA, MS, MEng, MEd, MSW, MBA)  Occupational History    Comment: [employer]  Tobacco Use  . Smoking status: Never Smoker  . Smokeless tobacco: Never Used  Vaping Use  . Vaping Use: Never used  Substance and Sexual Activity  . Alcohol use: Yes    Alcohol/week: 6.0 standard drinks    Types: 6 Cans of beer per week    Comment: 2-3 times per week   . Drug use: No  . Sexual activity: Yes    Birth control/protection: None  Other Topics Concern  . Not on file  Social History Narrative  . Not on file   Social Determinants of Health   Financial Resource Strain: Not on file  Food Insecurity: Not on file  Transportation Needs: Not on file  Physical Activity: Not on file  Stress: Not on file  Social Connections: Not on file    Allergies:  Allergies  Allergen Reactions  . Vicodin [Hydrocodone-Acetaminophen] Nausea And Vomiting    Metabolic Disorder Labs: No results found for: HGBA1C, MPG No results found for: PROLACTIN No results  found for: CHOL, TRIG, HDL, CHOLHDL, VLDL, LDLCALC No results found for: TSH  Therapeutic Level Labs: No results found for: LITHIUM No results found for: VALPROATE No components found for:  CBMZ  Current Medications: Current Outpatient Medications  Medication Sig Dispense Refill  . ARIPiprazole (ABILIFY) 2 MG tablet Take 1 tablet (2 mg total) by mouth daily. 90 tablet 1  . cyanocobalamin (V-R VITAMIN B-12) 500 MCG tablet Take 1,000 mcg by mouth.    . lamoTRIgine (LAMICTAL) 200 MG tablet Take 1 tablet (200 mg total) by mouth daily. 90 tablet 1  . zolpidem (AMBIEN) 10 MG tablet Take 1 tablet (10 mg total) by mouth at bedtime as needed for sleep. Take 1/2- 1 tab as needed for sleep 30 tablet 2   No  current facility-administered medications for this visit.      Psychiatric Specialty Exam: Review of Systems  There were no vitals taken for this visit.There is no height or weight on file to calculate BMI.  General Appearance: Fairly Groomed  Eye Contact:  Good  Speech:  Clear and Coherent and Normal Rate  Volume:  Normal  Mood:  Euthymic  Affect:  Congruent  Thought Process:  Goal Directed, Linear and Descriptions of Associations: Intact  Orientation:  Full (Time, Place, and Person)  Thought Content: Logical   Suicidal Thoughts:  No  Homicidal Thoughts:  No  Memory:  Recent;   Good Remote;   Good  Judgement:  Good  Insight:  Good  Psychomotor Activity:  Normal  Concentration:  Concentration: Good and Attention Span: Good  Recall:  Good  Fund of Knowledge: Good  Language: Negative  Akathisia:  Negative  Handed:  Right  AIMS (if indicated): not done  Assets:  Communication Skills Desire for Improvement Financial Resources/Insurance Housing Social Support  ADL's:  Intact  Cognition: WNL  Sleep:  Good    Assessment and Plan: Patient appears to be doing well on his current regimen.  1. Bipolar disorder in full remission, most recent episode unspecified type (HCC)  - ARIPiprazole (ABILIFY) 2 MG tablet; Take 1 tablet (2 mg total) by mouth daily.  Dispense: 90 tablet; Refill: 1 - lamoTRIgine (LAMICTAL) 200 MG tablet; Take 1 tablet (200 mg total) by mouth every morning.  Dispense: 90 tablet; Refill: 1 - zolpidem (AMBIEN) 10 MG tablet; Take 1 tablet (10 mg total) by mouth at bedtime as needed for sleep. Take 1/2- 1 tab as needed for sleep  Dispense: 30 tablet; Refill: 2  Continue same medication regimen. Follow up in 4 months.    Zena Amos, MD 03/20/2020, 3:35 PM

## 2020-07-15 ENCOUNTER — Encounter (HOSPITAL_COMMUNITY): Payer: Self-pay | Admitting: Psychiatry

## 2020-07-15 ENCOUNTER — Other Ambulatory Visit: Payer: Self-pay

## 2020-07-15 ENCOUNTER — Telehealth (INDEPENDENT_AMBULATORY_CARE_PROVIDER_SITE_OTHER): Payer: BC Managed Care – PPO | Admitting: Psychiatry

## 2020-07-15 DIAGNOSIS — F3170 Bipolar disorder, currently in remission, most recent episode unspecified: Secondary | ICD-10-CM | POA: Diagnosis not present

## 2020-07-15 MED ORDER — LAMOTRIGINE 200 MG PO TABS: 200.0000 mg | ORAL_TABLET | Freq: Every day | ORAL | 1 refills | Status: DC

## 2020-07-15 MED ORDER — ARIPIPRAZOLE 2 MG PO TABS: 2.0000 mg | ORAL_TABLET | Freq: Every day | ORAL | 1 refills | Status: DC

## 2020-07-15 MED ORDER — ZOLPIDEM TARTRATE 10 MG PO TABS: 10.0000 mg | ORAL_TABLET | Freq: Every evening | ORAL | 2 refills | Status: DC | PRN

## 2020-07-15 NOTE — Progress Notes (Signed)
BH MD OP Progress Note  Virtual Visit via Video Note  I connected with Jeffrey Hopkins on 07/15/20 at  3:00 PM EDT by a video enabled telemedicine application and verified that I am speaking with the correct person using two identifiers.  Location: Patient: Home Provider: Clinic   I discussed the limitations of evaluation and management by telemedicine and the availability of in person appointments. The patient expressed understanding and agreed to proceed.  I provided 13 minutes of non-face-to-face time during this encounter.      07/15/2020 3:00 PM Jeffrey Hopkins  MRN:  [id number]  Chief Complaint: " I am doing great."  HPI: Patient followed he is doing well.  He is enjoying his time at home during the summer break.  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  He stated that he and his family are planning to go up to [place] for a visit next month. He denies any other issues or concerns today. He informed that his mood and sleep are stable and he just needs refills as he wants to continue the same combination.   Visit Diagnosis:    ICD-10-CM   1. Bipolar disorder in full remission, most recent episode unspecified type (HCC)  F31.70       Past Psychiatric History: Bipolar disorder  Past Medical History:  Past Medical History:  Diagnosis Date   Anxiety    Bipolar disorder (HCC)    Depression    Kidney stone     Past Surgical History:  Procedure Laterality Date   none      Family Psychiatric History: see below  Family History:  Family History  Problem Relation Age of Onset   Anxiety disorder Father    Skin cancer Mother    Allergies Sister    Bipolar disorder Maternal Uncle    Colon cancer Paternal Grandfather    Kidney disease Neg Hx    Prostate cancer Neg Hx     Social History:  Social History   Socioeconomic History   Marital status: Married    Spouse name: JENNIFER   Number of children: 2   Years of education: Not on file    Highest education level: Master's degree (e.g., MA, MS, MEng, MEd, MSW, MBA)  Occupational History    Comment: [employer]  Tobacco Use   Smoking status: Never   Smokeless tobacco: Never  Vaping Use   Vaping Use: Never used  Substance and Sexual Activity   Alcohol use: Yes    Alcohol/week: 6.0 standard drinks    Types: 6 Cans of beer per week    Comment: 2-3 times per week    Drug use: No   Sexual activity: Yes    Birth control/protection: None  Other Topics Concern   Not on file  Social History Narrative   Not on file   Social Determinants of Health   Financial Resource Strain: Not on file  Food Insecurity: Not on file  Transportation Needs: Not on file  Physical Activity: Not on file  Stress: Not on file  Social Connections: Not on file    Allergies:  Allergies  Allergen Reactions   Vicodin [Hydrocodone-Acetaminophen] Nausea And Vomiting    Metabolic Disorder Labs: No results found for: HGBA1C, MPG No results found for: PROLACTIN No results found for: CHOL, TRIG, HDL, CHOLHDL, VLDL, LDLCALC No results found for: TSH  Therapeutic Level Labs: No results found for: LITHIUM No results found for: VALPROATE No components found for:  CBMZ  Current Medications: Current Outpatient Medications  Medication Sig Dispense Refill   ARIPiprazole (ABILIFY) 2 MG tablet Take 1 tablet (2 mg total) by mouth daily. 90 tablet 1   cyanocobalamin (V-R VITAMIN B-12) 500 MCG tablet Take 1,000 mcg by mouth.     lamoTRIgine (LAMICTAL) 200 MG tablet Take 1 tablet (200 mg total) by mouth daily. 90 tablet 1   zolpidem (AMBIEN) 10 MG tablet Take 1 tablet (10 mg total) by mouth at bedtime as needed for sleep. Take 1/2- 1 tab as needed for sleep 30 tablet 2   No current facility-administered medications for this visit.      Psychiatric Specialty Exam: Review of Systems  There were no vitals taken for this visit.There is no height or weight on file to calculate BMI.  General Appearance:  Fairly Groomed  Eye Contact:  Good  Speech:  Clear and Coherent and Normal Rate  Volume:  Normal  Mood:  Euthymic  Affect:  Congruent  Thought Process:  Goal Directed, Linear, and Descriptions of Associations: Intact  Orientation:  Full (Time, Place, and Person)  Thought Content: Logical   Suicidal Thoughts:  No  Homicidal Thoughts:  No  Memory:  Recent;   Good Remote;   Good  Judgement:  Good  Insight:  Good  Psychomotor Activity:  Normal  Concentration:  Concentration: Good and Attention Span: Good  Recall:  Good  Fund of Knowledge: Good  Language: Negative  Akathisia:  Negative  Handed:  Right  AIMS (if indicated): not done  Assets:  Communication Skills Desire for Improvement Financial Resources/Insurance Housing Social Support  ADL's:  Intact  Cognition: WNL  Sleep:  Good    Assessment and Plan: Patient is stable on the current regimen.  1. Bipolar disorder in full remission, most recent episode unspecified type (HCC)  - ARIPiprazole (ABILIFY) 2 MG tablet; Take 1 tablet (2 mg total) by mouth daily.  Dispense: 90 tablet; Refill: 1 - lamoTRIgine (LAMICTAL) 200 MG tablet; Take 1 tablet (200 mg total) by mouth every morning.  Dispense: 90 tablet; Refill: 1 - zolpidem (AMBIEN) 10 MG tablet; Take 1 tablet (10 mg total) by mouth at bedtime as needed for sleep. Take 1/2- 1 tab as needed for sleep  Dispense: 30 tablet; Refill: 2  Continue same medication regimen. Follow up in 3 months.  Patient was informed that his care is being transferred to another psychiatrist at [facility] psychiatry clinic due to the writer leaving the office.  He verbalized his understanding.  Zena Amos, MD 07/15/2020, 3:00 PM

## 2020-09-25 NOTE — Progress Notes (Deleted)
BH MD/PA/NP OP Progress Note  09/25/2020 12:51 PM Jeffrey Hopkins  MRN:  [id number]  Chief Complaint:  HPI:  Jeffrey Hopkins is a 50 y.o. year old male with a history of bipolar disorder, anxiety, who is transferred from Dr. Evelene Croon.          Visit Diagnosis: No diagnosis found.  Past Psychiatric History: Please see initial evaluation for full details. I have reviewed the history. No updates at this time.     Past Medical History:  Past Medical History:  Diagnosis Date  . Anxiety   . Bipolar disorder (HCC)   . Depression   . Kidney stone     Past Surgical History:  Procedure Laterality Date  . none      Family Psychiatric History: Please see initial evaluation for full details. I have reviewed the history. No updates at this time.     Family History:  Family History  Problem Relation Age of Onset  . Anxiety disorder Father   . Skin cancer Mother   . Allergies Sister   . Bipolar disorder Maternal Uncle   . Colon cancer Paternal Grandfather   . Kidney disease Neg Hx   . Prostate cancer Neg Hx     Social History:  Social History   Socioeconomic History  . Marital status: Married    Spouse name: JENNIFER  . Number of children: 2  . Years of education: Not on file  . Highest education level: Master's degree (e.g., MA, MS, MEng, MEd, MSW, MBA)  Occupational History    Comment: [employer]TIME  Tobacco Use  . Smoking status: Never  . Smokeless tobacco: Never  Vaping Use  . Vaping Use: Never used  Substance and Sexual Activity  . Alcohol use: Yes    Alcohol/week: 6.0 standard drinks    Types: 6 Cans of beer per week    Comment: 2-3 times per week   . Drug use: No  . Sexual activity: Yes    Birth control/protection: None  Other Topics Concern  . Not on file  Social History Narrative  . Not on file   Social Determinants of Health   Financial Resource Strain: Not on file  Food Insecurity: Not on file  Transportation Needs: Not on file  Physical Activity: Not on file   Stress: Not on file  Social Connections: Not on file    Allergies:  Allergies  Allergen Reactions  . Vicodin [Hydrocodone-Acetaminophen] Nausea And Vomiting    Metabolic Disorder Labs: No results found for: HGBA1C, MPG No results found for: PROLACTIN No results found for: CHOL, TRIG, HDL, CHOLHDL, VLDL, LDLCALC No results found for: TSH  Therapeutic Level Labs: No results found for: LITHIUM No results found for: VALPROATE No components found for:  CBMZ  Current Medications: Current Outpatient Medications  Medication Sig Dispense Refill  . ARIPiprazole (ABILIFY) 2 MG tablet Take 1 tablet (2 mg total) by mouth daily. 90 tablet 1  . cyanocobalamin (V-R VITAMIN B-12) 500 MCG tablet Take 1,000 mcg by mouth.    . lamoTRIgine (LAMICTAL) 200 MG tablet Take 1 tablet (200 mg total) by mouth daily. 90 tablet 1  . zolpidem (AMBIEN) 10 MG tablet Take 1 tablet (10 mg total) by mouth at bedtime as needed for sleep. Take 1/2- 1 tab as needed for sleep 30 tablet 2   No current facility-administered medications for this visit.     Musculoskeletal: Strength & Muscle Tone:  N/A Gait & Station:  N/A Patient leans: N/A  Psychiatric Specialty  Exam: Review of Systems  There were no vitals taken for this visit.There is no height or weight on file to calculate BMI.  General Appearance: {Appearance:22683}  Eye Contact:  {BHH EYE CONTACT:22684}  Speech:  Clear and Coherent  Volume:  Normal  Mood:  {BHH MOOD:22306}  Affect:  {Affect (PAA):22687}  Thought Process:  Coherent  Orientation:  Full (Time, Place, and Person)  Thought Content: Logical   Suicidal Thoughts:  {ST/HT (PAA):22692}  Homicidal Thoughts:  {ST/HT (PAA):22692}  Memory:  Immediate;   Good  Judgement:  {Judgement (PAA):22694}  Insight:  {Insight (PAA):22695}  Psychomotor Activity:  Normal  Concentration:  Concentration: Good and Attention Span: Good  Recall:  Good  Fund of Knowledge: Good  Language: Good  Akathisia:  No   Handed:  Right  AIMS (if indicated): not done  Assets:  Communication Skills Desire for Improvement  ADL's:  Intact  Cognition: WNL  Sleep:  {BHH GOOD/FAIR/POOR:22877}   Screenings:   Assessment and Plan:  Assessment  Plan   The patient demonstrates the following risk factors for suicide: Chronic risk factors for suicide include: {Chronic Risk Factors for IOEVOJJ:00938182}. Acute risk factors for suicide include: {Acute Risk Factors for XHBZJIR:67893810}. Protective factors for this patient include: {Protective Factors for Suicide FBPZ:02585277}. Considering these factors, the overall suicide risk at this point appears to be {Desc; low/moderate/high:110033}. Patient {ACTION; IS/IS OEU:23536144} appropriate for outpatient follow up.    Neysa Hotter, MD 09/25/2020, 12:51 PM  This encounter was created in error - please disregard.

## 2020-09-30 ENCOUNTER — Telehealth: Payer: Self-pay | Admitting: Psychiatry

## 2020-09-30 ENCOUNTER — Other Ambulatory Visit: Payer: Self-pay

## 2020-09-30 ENCOUNTER — Encounter: Payer: Self-pay | Admitting: Psychiatry

## 2020-09-30 NOTE — Telephone Encounter (Signed)
Sent link for video visit through Epic. Although it was briefly connected, it was abruptly ended. Called the patient twice on the phone. He did not aswer the phone. Left voice message to contact the office [phone]).

## 2020-10-02 NOTE — Progress Notes (Signed)
This encounter was created in error - please disregard.

## 2021-04-30 ENCOUNTER — Telehealth (HOSPITAL_COMMUNITY): Payer: Self-pay | Admitting: *Deleted

## 2021-04-30 ENCOUNTER — Telehealth: Payer: Self-pay

## 2021-04-30 DIAGNOSIS — F3170 Bipolar disorder, currently in remission, most recent episode unspecified: Secondary | ICD-10-CM

## 2021-04-30 MED ORDER — ARIPIPRAZOLE 2 MG PO TABS: 2.0000 mg | ORAL_TABLET | Freq: Every day | ORAL | 0 refills | Status: DC

## 2021-04-30 MED ORDER — LAMOTRIGINE 200 MG PO TABS: 200.0000 mg | ORAL_TABLET | Freq: Every day | ORAL | 0 refills | Status: DC

## 2021-04-30 NOTE — Telephone Encounter (Signed)
I reviewed the chart.  The patient is established patient in our office and last seen by Dr. Evelene Croon in June with no changes in his medication.  Dr Evelene Croon had left the practice.  We will provide a 30-day bridge supply of Abilify and Lamictal until seen by new provider.  Patient missed the appointment for his future refills. ? ?

## 2021-04-30 NOTE — Telephone Encounter (Signed)
Patient contacted this office as directed by [facility] staff to followup regarding medications. He states that he did not know that his appointment was a no show because he still received refills on his medications, which is how he is just now close to running out of medications. Patient states that his history is in this system and he has been on the same medication regimen for forever. He is requesting to have his medications refilled and scheduled an appointment for psychiatric services. Patient feels that he has been dropped through the cracks due to all of the miscommunication about his transition and would like to avoid hospitalization due to not having medications. He is planning on leaving town tomorrow and would like this issue resolved. Writer informed the patient that this message will be given to supervisors to review. Patient was understanding and requested a callback by EOD. ?

## 2021-04-30 NOTE — Telephone Encounter (Signed)
I do not feel comfortable prescribing medication without evaluation (I have never seen him). It is also noted that he would have run out his medication now/three months ago if he were to take medication consistently according to the conversation between the pharmacy and Jess. I will defer the decision to the other.

## 2021-04-30 NOTE — Telephone Encounter (Signed)
Medication refill and appointment - Telephone call with patient two times, with Darrick Penna, pharmacist at patient's [facility] and with Dr. Adele Schilder to verify patient has been taking medications as prescribed. and running out on schedule. Patient reported at his last visit on 09/30/20 he was not a no show but got cut off for his virtual connection and then his new provider sent in new orders plus refills.  Patient in agreement he must keep new appointment set up with Dr. Modesta Messing for 05/28/21 at 11:00 am and informed Dr. Adele Schilder agreed to fill his Abilify and Lamictal, both filled for 90 days on 01/28/21 one last time for 30 days supply of each.  Patient stated understanding and that he would come to the [place] office to see Dr. Modesta Messing at 10:45 am on 05/28/21.  Stated understanding no further refills until patient is evaluated again.  Dr. Adele Schilder sending in a 30 day supply of Abilify and Lamictal this date to patient's [facility] in [place].  ?

## 2021-04-30 NOTE — Telephone Encounter (Signed)
pt called requested a medication refill pt was explained that he would not be able to get a refill and that he would be considered a new pt because the last visit was in june of 2022. and was send by Dr. Toy Care. and Dr. Toy Care is no longer with our office. He was very upset and I asked him when was the last time he received his medication and he would only stated he had two pills left.  I explained to paitnet that he would have been out before now. He stated he wanted to speak to someone else. I gave him the [facility] #. ? ?I told him that he would have to go to the ER, [place] or to the [facility] in [place].  Pt was given the phone number to the [place] office' ? ?I [facility] pharmacy [facility] and when they looked it up the abilify was last picked up on 10-25-20 for 90 day supply., the lamictal was last picked up on 10-19-20 and the Lorrin Mais was last picked up on 01-09-21.   ?

## 2021-04-30 NOTE — Telephone Encounter (Signed)
Pharmacy request for patients lamotrgine and aripiprazole. He will not be getting rxs from this office as he was transitioned to the [place] office but no showed for his first appt, and he has not had meds since June 21.22 ?

## 2021-05-23 ENCOUNTER — Other Ambulatory Visit: Payer: Self-pay | Admitting: Psychiatry

## 2021-05-23 DIAGNOSIS — F3170 Bipolar disorder, currently in remission, most recent episode unspecified: Secondary | ICD-10-CM

## 2021-05-26 NOTE — Progress Notes (Addendum)
BH MD/PA/NP OP Progress Note ? ?05/28/2021 12:13 PM ?Guy Begin  ?MRN:  [id number] ? ?Chief Complaint:  ?Chief Complaint  ?Patient presents with  ? Establish Care  ? ?HPI:  ?Jeffrey Hopkins is a 51 y.o. year old male with a history of bipolar disorder, hyperlipidemia vitamin B 12 deficiency, who is transferred care from Dr. Evelene Croon. He was seen by Dr. Evelene Croon, last in June 2022. ? ?He states that he is to be seen by Dr. Garnetta Buddy for bipolar disorder.  He has been doing much better since being on lamotrigine.  He used to be a "mess," stating that he had mood swings, anger and depression around age 51-4.  He reports he was having high energy, euphoria ("better than great"), and doing self-destructive things.  When he was asked to elaborate it, he states that he was abusive to others, and doing things to himself (he reports having SI, although he denies any attempt).  He was functional at work despite these, although he was not happy person.  He feels stable now.  Loneliness thing he has noticed is that he has been feeling fatigued lately despite sleeping several hours.  Although he used to feel tired when he was depressed, it was due to him not wanting to be around with others.  He now feels fatigued despite he wants to do things.  He reports good relationship with his wife.  He states that he has so many good things in his life, including his marriage, children and his jobs.  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  Of note, he states that he thought he had a visit as usual (as with Dr. Evelene Croon in the past) when the video was disconnected at his previously scheduled appointment especially given he was able to get refills afterwards. He knows that he needs to be on his medication, and has decided to come to the appointment to get those despite frustration regarding recent communication with staffs. ? ?Depression-he has  depressive symptoms as in PHQ-9. He has gained weight as he has not been able to have time for exercise due to his work.  He eats late in the day, and occasionally skips meal.  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  He denies SI.  ? ?Mania-he denies decreased need for sleep, euphoria, or increased goal-directed activity. He denies hallucinations, paranoia.  ? ?Substance- he drinks 6 beers a week.  He denies any issues with alcohol.  He denies drug use.  ? ?Medication- lamotrigine 200 mg daily, Abilify 2 mg daily, Ambien 10 mg at night as needed for insomnia ? ? ?Wt Readings from Last 3 Encounters:  ?05/28/21 237 lb (107.5 kg)  ?02/06/18 234 lb 6.4 oz (106.3 kg)  ?10/10/17 241 lb 9.6 oz (109.6 kg)  ?  ?Household:  wife, one of his daughter ?Marital status: married for more than 20 years ?Number of children: 2 daughters ?Employment: Runner, broadcasting/film/video in high school, Runner, broadcasting/film/video ?Education:   ?Last PCP / ongoing medical evaluation:  PCP in Oct 2022 ?  ? ?Visit Diagnosis:  ?  ICD-10-CM   ?1. Insomnia, unspecified type  G47.00   ?  ?2. Bipolar disorder in full remission, most recent episode unspecified type (HCC)  F31.70 ARIPiprazole (ABILIFY) 2 MG tablet  ?  lamoTRIgine (LAMICTAL) 200 MG tablet  ?  ? ? ?  Past Psychiatric History:  ?Outpatient: Dr. Garnetta BuddyFaheem, Dr. Evelene CroonKaur ?Psychiatry admission: denies ?Previous suicide attempt: denies ?Past trials of medication: lamotrigine, Abilify, Ambien ?History of violence:   ? ?Past Medical History:  ?Past Medical History:  ?Diagnosis Date  ? Anxiety   ? Bipolar disorder (HCC)   ? Depression   ? Kidney stone   ?  ?Past Surgical History:  ?Procedure Laterality Date  ? none    ? ? ?Family Psychiatric History: as below ? ?Family History:  ?Family History  ?Problem Relation Age of Onset  ? Skin cancer Mother   ? Anxiety disorder Father   ? Depression Sister   ? Allergies Sister   ? Bipolar disorder Maternal Uncle   ? Colon cancer Paternal Grandfather   ?  Kidney disease Neg Hx   ? Prostate cancer Neg Hx   ? ? ?Social History:  ?Social History  ? ?Socioeconomic History  ? Marital status: Married  ?  Spouse name: JENNIFER  ? Number of children: 2  ? Years of education: Not on file  ? Highest education level: Master's degree (e.g., MA, MS, MEng, MEd, MSW, MBA)  ?Occupational History  ?  Comment: FULLTIME  ?Tobacco Use  ? Smoking status: Never  ? Smokeless tobacco: Never  ?Vaping Use  ? Vaping Use: Never used  ?Substance and Sexual Activity  ? Alcohol use: Yes  ?  Alcohol/week: 6.0 standard drinks  ?  Types: 6 Cans of beer per week  ? Drug use: No  ? Sexual activity: Yes  ?  Birth control/protection: None  ?Other Topics Concern  ? Not on file  ?Social History Narrative  ? Not on file  ? ?Social Determinants of Health  ? ?Financial Resource Strain: Not on file  ?Food Insecurity: Not on file  ?Transportation Needs: Not on file  ?Physical Activity: Not on file  ?Stress: Not on file  ?Social Connections: Not on file  ? ? ?Allergies:  ?Allergies  ?Allergen Reactions  ? Vicodin [Hydrocodone-Acetaminophen] Nausea And Vomiting  ? ? ?Metabolic Disorder Labs: ?No results found for: HGBA1C, MPG ?No results found for: PROLACTIN ?No results found for: CHOL, TRIG, HDL, CHOLHDL, VLDL, LDLCALC ?No results found for: TSH ? ?Therapeutic Level Labs: ?No results found for: LITHIUM ?No results found for: VALPROATE ?No components found for:  CBMZ ? ?Current Medications: ?Current Outpatient Medications  ?Medication Sig Dispense Refill  ? tiZANidine (ZANAFLEX) 4 MG capsule Take 4 mg by mouth 3 (three) times daily as needed for muscle spasms.    ? vitamin B-12 (CYANOCOBALAMIN) 500 MCG tablet Take 1,000 mcg by mouth.    ? zolpidem (AMBIEN) 10 MG tablet Take 1 tablet (10 mg total) by mouth at bedtime as needed for sleep. Take 1/2- 1 tab as needed for sleep 30 tablet 2  ? [START ON 05/31/2021] ARIPiprazole (ABILIFY) 2 MG tablet Take 1 tablet (2 mg total) by mouth daily. 30 tablet 1  ? [START ON  05/31/2021] lamoTRIgine (LAMICTAL) 200 MG tablet Take 1 tablet (200 mg total) by mouth daily. 30 tablet 1  ? ?No current facility-administered medications for this visit.  ? ? ? ?Musculoskeletal: ?Strength & Muscle Tone: within normal limits ?Gait & Station: normal ?Patient leans: N/A ? ?Psychiatric Specialty Exam: ?Review of Systems  ?Psychiatric/Behavioral:  Positive for sleep disturbance. Negative for agitation, behavioral problems, confusion, decreased concentration, dysphoric mood, hallucinations, self-injury and suicidal ideas. The patient is nervous/anxious. The patient is not hyperactive.   ?All other systems reviewed and are negative.  ?Blood  pressure 138/90, pulse 73, temperature 98.5 ?F (36.9 ?C), temperature source Temporal, height 6\' 1"  (1.854 m), weight 237 lb (107.5 kg).Body mass index is 31.27 kg/m?.  ?General Appearance: Casual  ?Eye Contact:  Good  ?Speech:  Clear and Coherent  ?Volume:  Normal  ?Mood:   good  ?Affect:  Appropriate, Congruent, and Full Range  ?Thought Process:  Coherent  ?Orientation:  Full (Time, Place, and Person)  ?Thought Content: Logical   ?Suicidal Thoughts:  No  ?Homicidal Thoughts:  No  ?Memory:  Immediate;   Good  ?Judgement:  Good  ?Insight:  Good  ?Psychomotor Activity:  Normal  ?Concentration:  Concentration: Good and Attention Span: Good  ?Recall:  Good  ?Fund of Knowledge: Good  ?Language: Good  ?Akathisia:  No  ?Handed:  Right  ?AIMS (if indicated): not done  ?Assets:  Communication Skills ?Desire for Improvement  ?ADL's:  Intact  ?Cognition: WNL  ?Sleep:   hypersomnia  ? ?Screenings: ?GAD-7   ? ?Flowsheet Row Office Visit from 05/28/2021 in [facility]  ?Total GAD-7 Score 4  ? ?  ? ?PHQ2-9   ? ?Flowsheet Row Office Visit from 05/28/2021 in [facility]  ?PHQ-2 Total Score 2  ?PHQ-9 Total Score 5  ? ?  ? ? ? ?Assessment and Plan:  ?Praveen Coia is a 51 y.o. year old male with a history of bipolar disorder,  hyperlipidemia vitamin B 12 deficiency, who is transferred care from Dr. 44. He was seen by Dr. Evelene Croon, last in June 2022.This is the first visit with this July 2022.  ? ?1. Bipolar disorder in full remission, most recent episod

## 2021-05-28 ENCOUNTER — Ambulatory Visit (INDEPENDENT_AMBULATORY_CARE_PROVIDER_SITE_OTHER): Payer: BC Managed Care – PPO | Admitting: Psychiatry

## 2021-05-28 ENCOUNTER — Encounter: Payer: Self-pay | Admitting: Psychiatry

## 2021-05-28 VITALS — BP 138/90 | HR 73 | Temp 98.5°F | Ht 73.0 in | Wt 237.0 lb

## 2021-05-28 DIAGNOSIS — G4700 Insomnia, unspecified: Secondary | ICD-10-CM | POA: Diagnosis not present

## 2021-05-28 DIAGNOSIS — F3170 Bipolar disorder, currently in remission, most recent episode unspecified: Secondary | ICD-10-CM

## 2021-05-28 MED ORDER — ARIPIPRAZOLE 2 MG PO TABS: 2.0000 mg | ORAL_TABLET | Freq: Every day | ORAL | 1 refills | Status: DC

## 2021-05-28 MED ORDER — LAMOTRIGINE 200 MG PO TABS: 200.0000 mg | ORAL_TABLET | Freq: Every day | ORAL | 1 refills | Status: DC

## 2021-07-15 NOTE — Progress Notes (Unsigned)
BH MD/PA/NP OP Progress Note  07/15/2021 5:37 PM Jeffrey Hopkins  MRN:  [id number]  Chief Complaint: No chief complaint on file.  HPI: *** Visit Diagnosis: No diagnosis found.  Past Psychiatric History: Please see initial evaluation for full details. I have reviewed the history. No updates at this time.     Past Medical History:  Past Medical History:  Diagnosis Date   Anxiety    Bipolar disorder (HCC)    Depression    Kidney stone     Past Surgical History:  Procedure Laterality Date   none      Family Psychiatric History: Please see initial evaluation for full details. I have reviewed the history. No updates at this time.     Family History:  Family History  Problem Relation Age of Onset   Skin cancer Mother    Anxiety disorder Father    Depression Sister    Allergies Sister    Bipolar disorder Maternal Uncle    Colon cancer Paternal Grandfather    Kidney disease Neg Hx    Prostate cancer Neg Hx     Social History:  Social History   Socioeconomic History   Marital status: Married    Spouse name: JENNIFER   Number of children: 2   Years of education: Not on file   Highest education level: Master's degree (e.g., MA, MS, MEng, MEd, MSW, MBA)  Occupational History    Comment: FULLTIME  Tobacco Use   Smoking status: Never   Smokeless tobacco: Never  Vaping Use   Vaping Use: Never used  Substance and Sexual Activity   Alcohol use: Yes    Alcohol/week: 6.0 standard drinks of alcohol    Types: 6 Cans of beer per week   Drug use: No   Sexual activity: Yes    Birth control/protection: None  Other Topics Concern   Not on file  Social History Narrative   Not on file   Social Determinants of Health   Financial Resource Strain: Low Risk  (12/13/2016)   Overall Financial Resource Strain (CARDIA)    Difficulty of Paying Living Expenses: Not hard at all  Food Insecurity: No Food Insecurity (12/13/2016)   Hunger Vital Sign    Worried About Running Out of Food in  the Last Year: Never true    Ran Out of Food in the Last Year: Never true  Transportation Needs: No Transportation Needs (12/13/2016)   PRAPARE - Administrator, Civil Service (Medical): No    Lack of Transportation (Non-Medical): No  Physical Activity: Insufficiently Active (12/13/2016)   Exercise Vital Sign    Days of Exercise per Week: 3 days    Minutes of Exercise per Session: 30 min  Stress: Stress Concern Present (12/13/2016)   [employer] of Occupational Health - Occupational Stress Questionnaire    Feeling of Stress : To some extent  Social Connections: Socially Integrated (12/13/2016)   Social Connection and Isolation Panel [NHANES]    Frequency of Communication with Friends and Family: Three times a week    Frequency of Social Gatherings with Friends and Family: Twice a week    Attends Religious Services: More than 4 times per year    Active Member of [employer] or Organizations: Yes    Attends Engineer, structural: More than 4 times per year    Marital Status: Married    Allergies:  Allergies  Allergen Reactions   Vicodin [Hydrocodone-Acetaminophen] Nausea And Vomiting    Metabolic Disorder Labs: No  results found for: "HGBA1C", "MPG" No results found for: "PROLACTIN" No results found for: "CHOL", "TRIG", "HDL", "CHOLHDL", "VLDL", "LDLCALC" No results found for: "TSH"  Therapeutic Level Labs: No results found for: "LITHIUM" No results found for: "VALPROATE" No results found for: "CBMZ"  Current Medications: Current Outpatient Medications  Medication Sig Dispense Refill   ARIPiprazole (ABILIFY) 2 MG tablet Take 1 tablet (2 mg total) by mouth daily. 30 tablet 1   lamoTRIgine (LAMICTAL) 200 MG tablet Take 1 tablet (200 mg total) by mouth daily. 30 tablet 1   tiZANidine (ZANAFLEX) 4 MG capsule Take 4 mg by mouth 3 (three) times daily as needed for muscle spasms.     vitamin B-12 (CYANOCOBALAMIN) 500 MCG tablet Take 1,000 mcg by mouth.      zolpidem (AMBIEN) 10 MG tablet Take 1 tablet (10 mg total) by mouth at bedtime as needed for sleep. Take 1/2- 1 tab as needed for sleep 30 tablet 2   No current facility-administered medications for this visit.     Musculoskeletal: Strength & Muscle Tone: within normal limits Gait & Station: normal Patient leans: N/A  Psychiatric Specialty Exam: Review of Systems  There were no vitals taken for this visit.There is no height or weight on file to calculate BMI.  General Appearance: {Appearance:22683}  Eye Contact:  {BHH EYE CONTACT:22684}  Speech:  Clear and Coherent  Volume:  Normal  Mood:  {BHH MOOD:22306}  Affect:  {Affect (PAA):22687}  Thought Process:  Coherent  Orientation:  Full (Time, Place, and Person)  Thought Content: Logical   Suicidal Thoughts:  {ST/HT (PAA):22692}  Homicidal Thoughts:  {ST/HT (PAA):22692}  Memory:  Immediate;   Good  Judgement:  {Judgement (PAA):22694}  Insight:  {Insight (PAA):22695}  Psychomotor Activity:  Normal  Concentration:  Concentration: Good and Attention Span: Good  Recall:  Good  Fund of Knowledge: Good  Language: Good  Akathisia:  No  Handed:  Right  AIMS (if indicated): not done  Assets:  Communication Skills Desire for Improvement  ADL's:  Intact  Cognition: WNL  Sleep:  {BHH GOOD/FAIR/POOR:22877}   Screenings: GAD-7    Flowsheet Row Office Visit from 05/28/2021 in [facility]  Total GAD-7 Score 4      PHQ2-9    Flowsheet Row Office Visit from 05/28/2021 in [facility]  PHQ-2 Total Score 2  PHQ-9 Total Score 5        Assessment and Plan:  Jeffrey Hopkins is a 51 y.o. year old male with a history of bipolar disorder, hyperlipidemia vitamin B 12 deficiency, who is transferred care from Dr. Evelene Croon. He was seen by Dr. Evelene Croon, last in June 2022, who presents for follow up appointment for below.   1. Bipolar disorder in full remission, most recent episode unspecified type  [facility]) He reports significant improvement in his mood symptoms since being on the current medication regimen except recent fatigue. Will continue lamotrigine to target bipolar disorder.  Discussed potential risk of Stevens-Johnson syndrome.  Will continue Abilify for bipolar disorder.  Discussed potential metabolic side effect and EPS.  Noted that he does have a weight gain, which he attributes to lack of regular exercise and unhealthy diet.  Discussed treatment option of metformin in the future if he were to continue to have weight gain.    2. Insomnia, unspecified type  He reports daytime fatigue despite sleeping for several hours.  He does snores at night.  He would like to hold evaluation of sleep apnea at this  time while hoping to lose his weight to help this.  Will continue Ambien as needed for insomnia.    Plan Continue lamotrigine 200 mg daily Continue Abilify 2 mg daily - monitor weight gain Continue Ambien 10 mg at night as needed for insomnia Next appointment: 6/27 at 10 AM for 30 mins, in person     The patient demonstrates the following risk factors for suicide: Chronic risk factors for suicide include: psychiatric disorder of bipolar disorder . Acute risk factors for suicide include: N/A. Protective factors for this patient include: positive social support, responsibility to others (children, family), coping skills, and hope for the future. Considering these factors, the overall suicide risk at this point appears to be low. Patient is appropriate for outpatient follow up.         Collaboration of Care: Collaboration of Care: {BH OP Collaboration of Care:21014065}  Patient/Guardian was advised Release of Information must be obtained prior to any record release in order to collaborate their care with an outside provider. Patient/Guardian was advised if they have not already done so to contact the registration department to sign all necessary forms in order for [place] to release  information regarding their care.   Consent: Patient/Guardian gives verbal consent for treatment and assignment of benefits for services provided during this visit. Patient/Guardian expressed understanding and agreed to proceed.    Neysa Hotter, MD 07/15/2021, 5:37 PM

## 2021-07-20 ENCOUNTER — Other Ambulatory Visit: Payer: Self-pay | Admitting: Psychiatry

## 2021-07-20 DIAGNOSIS — F3170 Bipolar disorder, currently in remission, most recent episode unspecified: Secondary | ICD-10-CM

## 2021-07-21 ENCOUNTER — Telehealth: Payer: BC Managed Care – PPO | Admitting: Psychiatry

## 2021-07-21 ENCOUNTER — Encounter: Payer: Self-pay | Admitting: Psychiatry

## 2021-07-21 VITALS — BP 126/86 | HR 83 | Temp 97.9°F | Wt 234.0 lb

## 2021-07-21 DIAGNOSIS — G4700 Insomnia, unspecified: Secondary | ICD-10-CM | POA: Diagnosis not present

## 2021-07-21 DIAGNOSIS — F3170 Bipolar disorder, currently in remission, most recent episode unspecified: Secondary | ICD-10-CM

## 2021-07-21 MED ORDER — LAMOTRIGINE 200 MG PO TABS: 200.0000 mg | ORAL_TABLET | Freq: Every day | ORAL | 2 refills | Status: DC

## 2021-07-21 MED ORDER — ARIPIPRAZOLE 2 MG PO TABS: 2.0000 mg | ORAL_TABLET | Freq: Every day | ORAL | 5 refills | Status: DC

## 2021-10-04 NOTE — Progress Notes (Unsigned)
BH MD/PA/NP OP Progress Note  10/06/2021 1:28 PM Jeffrey Hopkins  MRN:  [id number]  Chief Complaint:  Chief Complaint  Patient presents with   Follow-up   HPI:  This is a follow-up appointment for bipolar disorder.  He states that he has been doing well.  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  He enjoyed the job itself.  He reports good relationship with his family.  Although there was a day of him feeling hyper, trying to get his job done at night, he was able to make himself go to bed as he is aware of pattern of his mood.  He sleeps better.  He feels less fatigue.  He has been trying to eat healthy.  He takes a walk regularly, although he will need to find out the time now that the school starts.  He denies feeling depressed or anxiety.  He denies SI.  He denies increased goal directed activity.  He may drink a beer at times.  He denies drug use.  He feels comfortable to stay on the current medication regimen.   Household:  wife, one of his daughter Marital status: married for more than 20 years Number of children: 2 daughters Employment: Runner, broadcasting/film/video in high school, Runner, broadcasting/film/video Education:   Last PCP / ongoing medical evaluation:  PCP in Oct 2022  Wt Readings from Last 3 Encounters:  10/06/21 235 lb (106.6 kg)  07/21/21 234 lb (106.1 kg)  05/28/21 237 lb (107.5 kg)    Visit Diagnosis:    ICD-10-CM   1. Insomnia, unspecified type  G47.00     2. Bipolar disorder in full remission, most recent episode unspecified type (HCC)  F31.70 lamoTRIgine (LAMICTAL) 200 MG tablet      Past Psychiatric History: Please see initial evaluation for full details. I have reviewed the history. No updates at this time.     Past Medical History:  Past Medical History:  Diagnosis Date   Anxiety    Bipolar disorder (HCC)    Depression    Kidney stone     Past Surgical History:  Procedure Laterality Date   none      Family Psychiatric History: Please  see initial evaluation for full details. I have reviewed the history. No updates at this time.     Family History:  Family History  Problem Relation Age of Onset   Skin cancer Mother    Anxiety disorder Father    Depression Sister    Allergies Sister    Bipolar disorder Maternal Uncle    Colon cancer Paternal Grandfather    Kidney disease Neg Hx    Prostate cancer Neg Hx     Social History:  Social History   Socioeconomic History   Marital status: Married    Spouse name: JENNIFER   Number of children: 2   Years of education: Not on file   Highest education level: Master's degree (e.g., MA, MS, MEng, MEd, MSW, MBA)  Occupational History    Comment: FULLTIME  Tobacco Use   Smoking status: Never   Smokeless tobacco: Never  Vaping Use   Vaping Use: Never used  Substance and Sexual Activity   Alcohol use: Yes    Alcohol/week: 6.0 standard drinks of alcohol    Types: 6 Cans of beer per week   Drug use: No   Sexual activity: Yes    Birth control/protection: None  Other Topics Concern   Not on file  Social History Narrative   Not on file   Social Determinants of Health   Financial Resource Strain: Low Risk  (12/13/2016)   Overall Financial Resource Strain (CARDIA)    Difficulty of Paying Living Expenses: Not hard at all  Food Insecurity: No Food Insecurity (12/13/2016)   Hunger Vital Sign    Worried About Running Out of Food in the Last Year: Never true    Ran Out of Food in the Last Year: Never true  Transportation Needs: No Transportation Needs (12/13/2016)   PRAPARE - Administrator, Civil Service (Medical): No    Lack of Transportation (Non-Medical): No  Physical Activity: Insufficiently Active (12/13/2016)   Exercise Vital Sign    Days of Exercise per Week: 3 days    Minutes of Exercise per Session: 30 min  Stress: Stress Concern Present (12/13/2016)   [employer] of Occupational Health - Occupational Stress Questionnaire    Feeling of  Stress : To some extent  Social Connections: Socially Integrated (12/13/2016)   Social Connection and Isolation Panel [NHANES]    Frequency of Communication with Friends and Family: Three times a week    Frequency of Social Gatherings with Friends and Family: Twice a week    Attends Religious Services: More than 4 times per year    Active Member of [employer] or Organizations: Yes    Attends Engineer, structural: More than 4 times per year    Marital Status: Married    Allergies:  Allergies  Allergen Reactions   Vicodin [Hydrocodone-Acetaminophen] Nausea And Vomiting    Metabolic Disorder Labs: No results found for: "HGBA1C", "MPG" No results found for: "PROLACTIN" No results found for: "CHOL", "TRIG", "HDL", "CHOLHDL", "VLDL", "LDLCALC" No results found for: "TSH"  Therapeutic Level Labs: No results found for: "LITHIUM" No results found for: "VALPROATE" No results found for: "CBMZ"  Current Medications: Current Outpatient Medications  Medication Sig Dispense Refill   ARIPiprazole (ABILIFY) 2 MG tablet Take 1 tablet (2 mg total) by mouth daily. 30 tablet 5   [START ON 01/28/2022] ARIPiprazole (ABILIFY) 2 MG tablet Take 1 tablet (2 mg total) by mouth daily. 30 tablet 0   tiZANidine (ZANAFLEX) 4 MG capsule Take 4 mg by mouth 3 (three) times daily as needed for muscle spasms.     vitamin B-12 (CYANOCOBALAMIN) 500 MCG tablet Take 1,000 mcg by mouth.     [START ON 10/30/2021] lamoTRIgine (LAMICTAL) 200 MG tablet Take 1 tablet (200 mg total) by mouth daily. 30 tablet 3   No current facility-administered medications for this visit.     Musculoskeletal: Strength & Muscle Tone: within normal limits Gait & Station: normal Patient leans: N/A  Psychiatric Specialty Exam: Review of Systems  Psychiatric/Behavioral: Negative.    All other systems reviewed and are negative.   Blood pressure 129/84, pulse 73, height 6\' 1"  (1.854 m), weight 235 lb (106.6 kg).Body mass index is 31  kg/m.  General Appearance: Casual  Eye Contact:  Good  Speech:  Clear and Coherent  Volume:  Normal  Mood:   good  Affect:  Appropriate, Congruent, and calm  Thought Process:  Coherent  Orientation:  Full (Time, Place, and Person)  Thought Content: Logical   Suicidal Thoughts:  No  Homicidal Thoughts:  No  Memory:  Immediate;   Good  Judgement:  Good  Insight:  Good  Psychomotor Activity:  Normal, no rigidity, no tremors  Concentration:  Concentration: Good and Attention Span: Good  Recall:  Good  Fund of Knowledge: Good  Language: Good  Akathisia:  No  Handed:  Right  AIMS (if indicated): not done  Assets:  Communication Skills Desire for Improvement  ADL's:  Intact  Cognition: WNL  Sleep:  Good   Screenings: GAD-7    Flowsheet Row Office Visit from 05/28/2021 in [facility]  Total GAD-7 Score 4      PHQ2-9    Flowsheet Row Video Visit from 07/21/2021 in [facility] Office Visit from 05/28/2021 in [facility]  PHQ-2 Total Score 0 2  PHQ-9 Total Score 0 5        Assessment and Plan:  Jeffrey Hopkins is a 51 y.o. year old male with a history of  bipolar disorder, hyperlipidemia vitamin B 12 deficiency, who is transferred care from Dr. Evelene Croon. He was seen by Dr. Evelene Croon, last in June 2022,, who presents for follow up appointment for below.     1. Bipolar disorder in full remission, most recent episode unspecified type [facility]) He denies any significant mood symptoms except feeling hyper at night, although he was able to make himself sleep afterwards.  Will continue current dose of Abilify and lamotrigine to target bipolar disorder.  He denies any concern about his weight at this time after working on exercise.   2. Insomnia, unspecified type Improving.  Although he did have daytime fatigue and history of snoring, it has been improving.  He has not needed any Ambien; will continue to monitor  this.     Plan Continue lamotrigine 200 mg daily Continue Abilify 2 mg daily - monitor weight gain Next appointment: 1/4 1 PM for 30 mins, in person     The patient demonstrates the following risk factors for suicide: Chronic risk factors for suicide include: psychiatric disorder of bipolar disorder . Acute risk factors for suicide include: N/A. Protective factors for this patient include: positive social support, responsibility to others (children, family), coping skills, and hope for the future. Considering these factors, the overall suicide risk at this point appears to be low. Patient is appropriate for outpatient follow up.       Collaboration of Care: Collaboration of Care: Other N/A  Patient/Guardian was advised Release of Information must be obtained prior to any record release in order to collaborate their care with an outside provider. Patient/Guardian was advised if they have not already done so to contact the registration department to sign all necessary forms in order for [place] to release information regarding their care.   Consent: Patient/Guardian gives verbal consent for treatment and assignment of benefits for services provided during this visit. Patient/Guardian expressed understanding and agreed to proceed.    Neysa Hotter, MD 10/06/2021, 1:28 PM

## 2021-10-06 ENCOUNTER — Encounter: Payer: Self-pay | Admitting: Psychiatry

## 2021-10-06 ENCOUNTER — Ambulatory Visit: Payer: BC Managed Care – PPO | Admitting: Psychiatry

## 2021-10-06 VITALS — BP 129/84 | HR 73 | Ht 73.0 in | Wt 235.0 lb

## 2021-10-06 DIAGNOSIS — G4700 Insomnia, unspecified: Secondary | ICD-10-CM

## 2021-10-06 DIAGNOSIS — F3170 Bipolar disorder, currently in remission, most recent episode unspecified: Secondary | ICD-10-CM

## 2021-10-06 MED ORDER — ARIPIPRAZOLE 2 MG PO TABS: 2.0000 mg | ORAL_TABLET | Freq: Every day | ORAL | 0 refills | Status: DC

## 2021-10-06 MED ORDER — LAMOTRIGINE 200 MG PO TABS: 200.0000 mg | ORAL_TABLET | Freq: Every day | ORAL | 3 refills | Status: DC

## 2022-01-26 NOTE — Progress Notes (Signed)
[place] MD/PA/NP OP Progress Note  01/28/2022 1:32 PM Jeffrey Hopkins  MRN:  [id number]  Chief Complaint:  Chief Complaint  Patient presents with   Follow-up   HPI:  This is a follow-up appointment for bipolar disorder.  He states that he has been doing well.  He had a good Thanksgiving with his family.  He has not been able to do exercise as much due to being busy.  He now has a treadmill at home, and is hoping to work on this.  He likes his work.  He is not as active during the summertime.  He denies feeling depressed or anxiety.  He sleeps several hours, and feels refreshed in the morning.  He feels tired later in the day, which she attributes to darkness.  He denies SI.  He denies decreased need for sleep or euphonia.  He drinks 6 beers total per week.  He denies drug use.  He feels comfortable to stay on the medication as it is.  Household:  wife, one of his daughter Marital status: married for more than 20 years Number of children: 2 daughters Employment: Pharmacist, hospital in high school, Journalist, newspaper Education:   Last PCP / ongoing medical evaluation:  PCP in Oct 2022  Wt Readings from Last 3 Encounters:  01/28/22 229 lb (103.9 kg)  10/06/21 235 lb (106.6 kg)  07/21/21 234 lb (106.1 kg)     Household:  wife, one of his daughter Marital status: married for more than 20 years Number of children: 2 daughters Employment: Pharmacist, hospital in high school, Journalist, newspaper Education:   Last PCP / ongoing medical evaluation:  PCP in Oct 2022  Visit Diagnosis:    ICD-10-CM   1. Bipolar disorder in full remission, most recent episode unspecified type ([place])  F31.70 ARIPiprazole (ABILIFY) 2 MG tablet    EKG 12-Lead    lamoTRIgine (LAMICTAL) 200 MG tablet      Past Psychiatric History: Please see initial evaluation for full details. I have reviewed the history. No updates at this time.     Past Medical History:  Past Medical History:  Diagnosis Date   Anxiety    Bipolar disorder ([place])     Depression    Kidney stone     Past Surgical History:  Procedure Laterality Date   none      Family Psychiatric History: Please see initial evaluation for full details. I have reviewed the history. No updates at this time.     Family History:  Family History  Problem Relation Age of Onset   Skin cancer Mother    Anxiety disorder Father    Depression Sister    Allergies Sister    Bipolar disorder Maternal Uncle    Colon cancer Paternal Grandfather    Kidney disease Neg Hx    Prostate cancer Neg Hx     Social History:  Social History   Socioeconomic History   Marital status: Married    Spouse name: Jeffrey Hopkins   Number of children: 2   Years of education: Not on file   Highest education level: Master's degree (e.g., MA, MS, MEng, MEd, MSW, MBA)  Occupational History    Comment: FULLTIME  Tobacco Use   Smoking status: Never   Smokeless tobacco: Never  Vaping Use   Vaping Use: Never used  Substance and Sexual Activity   Alcohol use: Yes    Alcohol/week: 6.0 standard drinks of alcohol    Types: 6 Cans of beer per week   Drug use:  No   Sexual activity: Yes    Birth control/protection: None  Other Topics Concern   Not on file  Social History Narrative   Not on file   Social Determinants of Health   Financial Resource Strain: Low Risk  (12/13/2016)   Overall Financial Resource Strain (CARDIA)    Difficulty of Paying Living Expenses: Not hard at all  Food Insecurity: No Food Insecurity (12/13/2016)   Hunger Vital Sign    Worried About Running Out of Food in the Last Year: Never true    Ran Out of Food in the Last Year: Never true  Transportation Needs: No Transportation Needs (12/13/2016)   PRAPARE - Administrator, Civil Service (Medical): No    Lack of Transportation (Non-Medical): No  Physical Activity: Insufficiently Active (12/13/2016)   Exercise Vital Sign    Days of Exercise per Week: 3 days    Minutes of Exercise per Session: 30 min  Stress:  Stress Concern Present (12/13/2016)   [employer] of Occupational Health - Occupational Stress Questionnaire    Feeling of Stress : To some extent  Social Connections: Socially Integrated (12/13/2016)   Social Connection and Isolation Panel [NHANES]    Frequency of Communication with Friends and Family: Three times a week    Frequency of Social Gatherings with Friends and Family: Twice a week    Attends Religious Services: More than 4 times per year    Active Member of [employer] or Organizations: Yes    Attends Engineer, structural: More than 4 times per year    Marital Status: Married    Allergies:  Allergies  Allergen Reactions   Vicodin [Hydrocodone-Acetaminophen] Nausea And Vomiting    Metabolic Disorder Labs: No results found for: "HGBA1C", "MPG" No results found for: "PROLACTIN" No results found for: "CHOL", "TRIG", "HDL", "CHOLHDL", "VLDL", "LDLCALC" No results found for: "TSH"  Therapeutic Level Labs: No results found for: "LITHIUM" No results found for: "VALPROATE" No results found for: "CBMZ"  Current Medications: Current Outpatient Medications  Medication Sig Dispense Refill   ARIPiprazole (ABILIFY) 2 MG tablet Take 1 tablet (2 mg total) by mouth daily. 30 tablet 0   tiZANidine (ZANAFLEX) 4 MG capsule Take 4 mg by mouth 3 (three) times daily as needed for muscle spasms.     vitamin B-12 (CYANOCOBALAMIN) 500 MCG tablet Take 1,000 mcg by mouth.     [START ON 02/27/2022] ARIPiprazole (ABILIFY) 2 MG tablet Take 1 tablet (2 mg total) by mouth daily. 30 tablet 4   [START ON 02/27/2022] lamoTRIgine (LAMICTAL) 200 MG tablet Take 1 tablet (200 mg total) by mouth daily. 30 tablet 4   No current facility-administered medications for this visit.     Musculoskeletal: Strength & Muscle Tone: within normal limits Gait & Station: normal Patient leans: N/A  Psychiatric Specialty Exam: Review of Systems  Blood pressure 138/89, pulse 71, temperature 98.3 F (36.8 C),  height 6' 1.75" (1.873 m), weight 229 lb (103.9 kg), SpO2 97 %.Body mass index is 29.6 kg/m.  General Appearance: Fairly Groomed  Eye Contact:  Good  Speech:  Clear and Coherent  Volume:  Normal  Mood:   good  Affect:  Appropriate, Congruent, and Full Range  Thought Process:  Coherent  Orientation:  Full (Time, Place, and Person)  Thought Content: Logical   Suicidal Thoughts:  No  Homicidal Thoughts:  No  Memory:  Immediate;   Good  Judgement:  Good  Insight:  Good  Psychomotor Activity:  Normal,  Normal tone, no rigidity, no resting/postural tremors, no tardive dyskinesia    Concentration:  Concentration: Good and Attention Span: Good  Recall:  Good  Fund of Knowledge: Good  Language: Good  Akathisia:  No  Handed:  Right  AIMS (if indicated): not done  Assets:  Communication Skills Desire for Improvement  ADL's:  Intact  Cognition: WNL  Sleep:  Good   Screenings: GAD-7    Flowsheet Row Office Visit from 01/28/2022 in [place] Office Visit from 05/28/2021 in [place]  Total GAD-7 Score 3 4      PHQ2-9    [place] Office Visit from 01/28/2022 in [place] Video Visit from 07/21/2021 in [place] Office Visit from 05/28/2021 in [place]  PHQ-2 Total Score 0 0 2  PHQ-9 Total Score -- 0 5      [place] Office Visit from 01/28/2022 in [place] No Risk        Assessment and Plan:  Benjiman Sedgwick is a 52 y.o. year old male with a history of bipolar disorder, hyperlipidemia vitamin B 12 deficiency, who is transferred care from Dr. Toy Care. He was seen by Dr. Toy Care, last in June 2022, who presents for follow up appointment for below.   1. Bipolar disorder in full remission, most recent episode unspecified type [facility]) He denies any significant mood symptoms except  self-limited fatigue, which she attributes to darkness.  Will continue current dose of Abilify and lamotrigine to target bipolar disorder.  Will obtain EKG to check baseline QTc.     2. Insomnia, unspecified type Improving.  Although he did have daytime fatigue and history of snoring, it has been improving.  He has not needed any Ambien; will continue to monitor this.      Plan Continue lamotrigine 200 mg daily Continue Abilify 2 mg daily - monitor weight gain Next appointment: 6/20 at 1 PM for 30 mins or sooner if any worsening in his mood symptoms     The patient demonstrates the following risk factors for suicide: Chronic risk factors for suicide include: psychiatric disorder of bipolar disorder . Acute risk factors for suicide include: N/A. Protective factors for this patient include: positive social support, responsibility to others (children, family), coping skills, and hope for the future. Considering these factors, the overall suicide risk at this point appears to be low. Patient is appropriate for outpatient follow up.             Collaboration of Care: Collaboration of Care: Other reviewed notes in Epic  Patient/Guardian was advised Release of Information must be obtained prior to any record release in order to collaborate their care with an outside provider. Patient/Guardian was advised if they have not already done so to contact the registration department to sign all necessary forms in order for [place] to release information regarding their care.   Consent: Patient/Guardian gives verbal consent for treatment and assignment of benefits for services provided during this visit. Patient/Guardian expressed understanding and agreed to proceed.    Norman Clay, MD 01/28/2022, 1:32 PM

## 2022-01-28 ENCOUNTER — Ambulatory Visit: Payer: BC Managed Care – PPO | Admitting: Psychiatry

## 2022-01-28 ENCOUNTER — Encounter: Payer: Self-pay | Admitting: Psychiatry

## 2022-01-28 DIAGNOSIS — F3170 Bipolar disorder, currently in remission, most recent episode unspecified: Secondary | ICD-10-CM

## 2022-01-28 MED ORDER — ARIPIPRAZOLE 2 MG PO TABS: 2.0000 mg | ORAL_TABLET | Freq: Every day | ORAL | 4 refills | Status: DC

## 2022-01-28 MED ORDER — LAMOTRIGINE 200 MG PO TABS: 200.0000 mg | ORAL_TABLET | Freq: Every day | ORAL | 4 refills | Status: DC

## 2022-01-28 NOTE — Patient Instructions (Signed)
Continue lamotrigine 200 mg daily Continue Abilify 2 mg daily  Next appointment: 6/20 at 1 PM

## 2022-06-09 ENCOUNTER — Ambulatory Visit: Payer: BC Managed Care – PPO

## 2022-06-09 DIAGNOSIS — Z1211 Encounter for screening for malignant neoplasm of colon: Secondary | ICD-10-CM | POA: Diagnosis not present

## 2022-06-09 DIAGNOSIS — K635 Polyp of colon: Secondary | ICD-10-CM

## 2022-07-15 ENCOUNTER — Ambulatory Visit: Payer: BC Managed Care – PPO | Admitting: Psychiatry

## 2022-07-17 ENCOUNTER — Other Ambulatory Visit: Payer: Self-pay | Admitting: Psychiatry

## 2022-07-17 DIAGNOSIS — F3170 Bipolar disorder, currently in remission, most recent episode unspecified: Secondary | ICD-10-CM

## 2022-07-23 ENCOUNTER — Telehealth: Payer: Self-pay | Admitting: Psychiatry

## 2022-07-23 ENCOUNTER — Other Ambulatory Visit: Payer: Self-pay | Admitting: Psychiatry

## 2022-07-23 DIAGNOSIS — F3170 Bipolar disorder, currently in remission, most recent episode unspecified: Secondary | ICD-10-CM

## 2022-07-23 MED ORDER — LAMOTRIGINE 200 MG PO TABS
200.0000 mg | ORAL_TABLET | Freq: Every day | ORAL | 0 refills | Status: DC
Start: 2022-07-23 — End: 2022-08-18

## 2022-07-23 MED ORDER — ARIPIPRAZOLE 2 MG PO TABS
2.0000 mg | ORAL_TABLET | Freq: Every day | ORAL | 5 refills | Status: DC
Start: 2022-07-23 — End: 2022-11-15

## 2022-07-23 NOTE — Telephone Encounter (Signed)
Noted, thanks!

## 2022-07-23 NOTE — Telephone Encounter (Signed)
Ordered. Could you advise him to obtain an EKG at [facility] as discussed at his last visit? Thanks.

## 2022-07-23 NOTE — Telephone Encounter (Signed)
Spoke to patient he stated that he did not know he was suppose to go have the EKG immediately he is going out of town on today and will not be back until 08/02/22 patient was given the number for [facility]  EKG and stated that he would call.

## 2022-07-23 NOTE — Telephone Encounter (Signed)
Patient notified medication was ordered. He states he will not get the EKG done by 08/02/22 since he will be out of town next week.

## 2022-07-23 NOTE — Telephone Encounter (Signed)
Patient called stating he needs a refill on both medications, Abilify and Lamictal. His medication will run out by his next appt 08/02/22. Please advise

## 2022-08-02 ENCOUNTER — Ambulatory Visit: Payer: BC Managed Care – PPO | Admitting: Psychiatry

## 2022-08-03 ENCOUNTER — Ambulatory Visit: Payer: BC Managed Care – PPO | Attending: Internal Medicine

## 2022-08-16 ENCOUNTER — Other Ambulatory Visit: Payer: Self-pay | Admitting: Psychiatry

## 2022-08-16 DIAGNOSIS — F3170 Bipolar disorder, currently in remission, most recent episode unspecified: Secondary | ICD-10-CM

## 2022-08-18 ENCOUNTER — Other Ambulatory Visit: Payer: Self-pay | Admitting: Psychiatry

## 2022-08-18 DIAGNOSIS — F3170 Bipolar disorder, currently in remission, most recent episode unspecified: Secondary | ICD-10-CM

## 2022-08-18 MED ORDER — LAMOTRIGINE 200 MG PO TABS
200.0000 mg | ORAL_TABLET | Freq: Every day | ORAL | 0 refills | Status: DC
Start: 2022-08-22 — End: 2022-09-06

## 2022-08-18 NOTE — Telephone Encounter (Signed)
Ordered

## 2022-08-18 NOTE — Telephone Encounter (Signed)
received fax 2nd attempt . pt needs refill on the lamotrigine 200mg  . pt was last seen on 1-4 next appt 8-1

## 2022-08-21 NOTE — Progress Notes (Deleted)
BH MD/PA/NP OP Progress Note  08/21/2022 5:18 PM Kolden Bernert  MRN:  [id number]  Chief Complaint: No chief complaint on file.  HPI: ***  EKG? Visit Diagnosis: No diagnosis found.  Past Psychiatric History: Please see initial evaluation for full details. I have reviewed the history. No updates at this time.     Past Medical History:  Past Medical History:  Diagnosis Date   Anxiety    Bipolar disorder (HCC)    Depression    Kidney stone     Past Surgical History:  Procedure Laterality Date   none      Family Psychiatric History: Please see initial evaluation for full details. I have reviewed the history. No updates at this time.     Family History:  Family History  Problem Relation Age of Onset   Skin cancer Mother    Anxiety disorder Father    Depression Sister    Allergies Sister    Bipolar disorder Maternal Uncle    Colon cancer Paternal Grandfather    Kidney disease Neg Hx    Prostate cancer Neg Hx     Social History:  Social History   Socioeconomic History   Marital status: Married    Spouse name: JENNIFER   Number of children: 2   Years of education: Not on file   Highest education level: Master's degree (e.g., MA, MS, MEng, MEd, MSW, MBA)  Occupational History    Comment: FULLTIME  Tobacco Use   Smoking status: Never   Smokeless tobacco: Never  Vaping Use   Vaping status: Never Used  Substance and Sexual Activity   Alcohol use: Yes    Alcohol/week: 6.0 standard drinks of alcohol    Types: 6 Cans of beer per week   Drug use: No   Sexual activity: Yes    Birth control/protection: None  Other Topics Concern   Not on file  Social History Narrative   Not on file   Social Determinants of Health   Financial Resource Strain: Low Risk  (12/13/2016)   Overall Financial Resource Strain (CARDIA)    Difficulty of Paying Living Expenses: Not hard at all  Food Insecurity: No Food Insecurity (12/13/2016)   Hunger Vital Sign    Worried About Running Out  of Food in the Last Year: Never true    Ran Out of Food in the Last Year: Never true  Transportation Needs: No Transportation Needs (12/13/2016)   PRAPARE - Administrator, Civil Service (Medical): No    Lack of Transportation (Non-Medical): No  Physical Activity: Insufficiently Active (12/13/2016)   Exercise Vital Sign    Days of Exercise per Week: 3 days    Minutes of Exercise per Session: 30 min  Stress: Stress Concern Present (12/13/2016)   [employer] of Occupational Health - Occupational Stress Questionnaire    Feeling of Stress : To some extent  Social Connections: Socially Integrated (12/13/2016)   Social Connection and Isolation Panel [NHANES]    Frequency of Communication with Friends and Family: Three times a week    Frequency of Social Gatherings with Friends and Family: Twice a week    Attends Religious Services: More than 4 times per year    Active Member of [employer] or Organizations: Yes    Attends Engineer, structural: More than 4 times per year    Marital Status: Married    Allergies:  Allergies  Allergen Reactions   Vicodin [Hydrocodone-Acetaminophen] Nausea And Vomiting    Metabolic Disorder  Labs: No results found for: "HGBA1C", "MPG" No results found for: "PROLACTIN" No results found for: "CHOL", "TRIG", "HDL", "CHOLHDL", "VLDL", "LDLCALC" No results found for: "TSH"  Therapeutic Level Labs: No results found for: "LITHIUM" No results found for: "VALPROATE" No results found for: "CBMZ"  Current Medications: Current Outpatient Medications  Medication Sig Dispense Refill   ARIPiprazole (ABILIFY) 2 MG tablet Take 1 tablet (2 mg total) by mouth daily. 30 tablet 0   ARIPiprazole (ABILIFY) 2 MG tablet Take 1 tablet (2 mg total) by mouth daily. 30 tablet 5   [START ON 08/22/2022] lamoTRIgine (LAMICTAL) 200 MG tablet Take 1 tablet (200 mg total) by mouth daily. 30 tablet 0   tiZANidine (ZANAFLEX) 4 MG capsule Take 4 mg by mouth 3 (three)  times daily as needed for muscle spasms.     vitamin B-12 (CYANOCOBALAMIN) 500 MCG tablet Take 1,000 mcg by mouth.     No current facility-administered medications for this visit.     Musculoskeletal: Strength & Muscle Tone: within normal limits Gait & Station: normal Patient leans: N/A  Psychiatric Specialty Exam: Review of Systems  There were no vitals taken for this visit.There is no height or weight on file to calculate BMI.  General Appearance: {Appearance:22683}  Eye Contact:  {BHH EYE CONTACT:22684}  Speech:  Clear and Coherent  Volume:  Normal  Mood:  {BHH MOOD:22306}  Affect:  {Affect (PAA):22687}  Thought Process:  Coherent  Orientation:  Full (Time, Place, and Person)  Thought Content: Logical   Suicidal Thoughts:  {ST/HT (PAA):22692}  Homicidal Thoughts:  {ST/HT (PAA):22692}  Memory:  Immediate;   Good  Judgement:  {Judgement (PAA):22694}  Insight:  {Insight (PAA):22695}  Psychomotor Activity:  Normal  Concentration:  Concentration: Good and Attention Span: Good  Recall:  Good  Fund of Knowledge: Good  Language: Good  Akathisia:  No  Handed:  Right  AIMS (if indicated): not done  Assets:  Communication Skills Desire for Improvement  ADL's:  Intact  Cognition: WNL  Sleep:  {BHH GOOD/FAIR/POOR:22877}   Screenings: GAD-7    Flowsheet Row Office Visit from 01/28/2022 in [facility] Office Visit from 05/28/2021 in [facility]  Total GAD-7 Score 3 4      PHQ2-9    Flowsheet Row Office Visit from 01/28/2022 in [facility] Video Visit from 07/21/2021 in [facility] Office Visit from 05[facility]28/2021 in [facility]  PHQ-2 Total Score 0 0 2  PHQ-9 Total Score -- 0 5      Flowsheet Row Office Visit from 01/28/2022 in [facility]   C-SSRS RISK CATEGORY No Risk        Assessment and Plan:  Curtice Venkatesan is a 52 y.o. year old male with a history of bipolar disorder, hyperlipidemia vitamin B 12 deficiency, who is transferred care from Dr. Evelene Croon. He was seen by Dr. Evelene Croon, last in June 2022, who presents for follow up appointment for below.    1. Bipolar disorder in full remission, most recent episode unspecified type [facility]) He denies any significant mood symptoms except self-limited fatigue, which she attributes to darkness.  Will continue current dose of Abilify and lamotrigine to target bipolar disorder.  Will obtain EKG to check baseline QTc.      2. Insomnia, unspecified type Improving.  Although he did have daytime fatigue and history of snoring, it has been improving.  He has not needed any Ambien; will continue to monitor this.      Plan Continue lamotrigine 200 mg daily Continue Abilify 2 mg daily - monitor weight gain Next appointment: 6/20 at 1 PM for 30 mins or sooner if any worsening in his mood symptoms     The patient demonstrates the following risk factors for suicide: Chronic risk factors for suicide include: psychiatric disorder of bipolar disorder . Acute risk factors for suicide include: N/A. Protective factors for this patient include: positive social support, responsibility to others (children, family), coping skills, and hope for the future. Considering these factors, the overall suicide risk at this point appears to be low. Patient is appropriate for outpatient follow up.     Collaboration of Care: Collaboration of Care: {BH OP Collaboration of Care:21014065}  Patient/Guardian was advised Release of Information must be obtained prior to any record release in order to collaborate their care with an outside provider. Patient/Guardian was advised if they have not already done so to contact the registration department to sign all necessary forms in order for [place] to release information regarding their care.    Consent: Patient/Guardian gives verbal consent for treatment and assignment of benefits for services provided during this visit. Patient/Guardian expressed understanding and agreed to proceed.    Neysa Hotter, MD 08/21/2022, 5:18 PM

## 2022-08-26 ENCOUNTER — Ambulatory Visit: Payer: BC Managed Care – PPO | Admitting: Psychiatry

## 2022-09-02 NOTE — Progress Notes (Addendum)
BH MD/PA/NP OP Progress Note  09/06/2022 5:47 PM Jeffrey Hopkins  MRN:  [id number]  Chief Complaint:  Chief Complaint  Patient presents with   Follow-up   HPI:  This is a follow-up appointment for bipolar 1 disorder.  He states that he has been doing well.  He does not feel fatigue as much except that he feels tired later in the day due to work. During the visit, he calmly expressed his frustration about being asked the same questions repeatedly. He does not feel that this writer knows about him, and feels like he is just a number. He had to advocate himself to get refill when the appointment was rescheduled.  He has significant issues with our entire office, including the front desk staff. He wonders if it may be also secondary to change in staff members. They were there for him when he struggled the most. He states that he really liked Dr. Garnetta Buddy, who understands him, and who also shared about herself.  Although he was communicating with his wife to give a chance to see how it goes today, he has made up his mind about this writer's bed side manner was terrible. He states that he did not intentionally talk, and he wasn't spoken to while getting his vitals/BP. He states that this Clinical research associate did not review the screening sheet at all when it was handed over. He would like his care to be transferred to his primary care provider, and how he can proceed with this, and wants to ensure he has medication to last as these are very important to him. While talking through this process, there is a concern of weight gain.  He used to weigh 215 lbs prior to being on the current combination of medication, although there has been no change in appetite.  He has been also very active at work. He does not know which medication is contributing to weight gain.  Discussed possible medication adjustment in the future.  He will first discuss with his primary care provider regarding the transfer, and will keep the appointment with this  provider if the primary care does not take over the psychiatry care.  He denies feeling depressed, insomnia.  He denies SI.  He denies decreased need for sleep or euphoria.   Household:  wife, one of his daughter Marital status: married for more than 20 years Number of children: 2 daughters Employment: Runner, broadcasting/film/video in high school, [employer]ner, broadcasting/film/video  Wt Readings from Last 3 Encounters:  09/06/22 245 lb 3.2 oz (111.2 kg)  01/28/22 229 lb (103.9 kg)  10/06/21 235 lb (106.6 kg)     Visit Diagnosis:    ICD-10-CM   1. Bipolar disorder in full remission, most recent episode unspecified type (HCC)  F31.70 lamoTRIgine (LAMICTAL) 200 MG tablet    2. Insomnia, unspecified type  G47.00       Past Psychiatric History: Please see initial evaluation for full details. I have reviewed the history. No updates at this time.     Past Medical History:  Past Medical History:  Diagnosis Date   Anxiety    Bipolar disorder (HCC)    Depression    Kidney stone     Past Surgical History:  Procedure Laterality Date   none      Family Psychiatric History: Please see initial evaluation for full details. I have reviewed the history. No updates at this time.     Family History:  Family History  Problem Relation Age of Onset   Skin  cancer Mother    Anxiety disorder Father    Depression Sister    Allergies Sister    Bipolar disorder Maternal Uncle    Colon cancer Paternal Grandfather    Kidney disease Neg Hx    Prostate cancer Neg Hx     Social History:  Social History   Socioeconomic History   Marital status: Married    Spouse name: Jeffrey Hopkins   Number of children: 2   Years of education: Not on file   Highest education level: Master's degree (e.g., MA, MS, MEng, MEd, MSW, MBA)  Occupational History    Comment: FULLTIME  Tobacco Use   Smoking status: Never   Smokeless tobacco: Never  Vaping Use   Vaping status: Never Used  Substance and Sexual Activity   Alcohol use: Yes     Alcohol/week: 6.0 standard drinks of alcohol    Types: 6 Cans of beer per week   Drug use: No   Sexual activity: Yes    Birth control/protection: None  Other Topics Concern   Not on file  Social History Narrative   Not on file   Social Determinants of Health   Financial Resource Strain: Low Risk  (12/13/2016)   Overall Financial Resource Strain (CARDIA)    Difficulty of Paying Living Expenses: Not hard at all  Food Insecurity: No Food Insecurity (12/13/2016)   Hunger Vital Sign    Worried About Running Out of Food in the Last Year: Never true    Ran Out of Food in the Last Year: Never true  Transportation Needs: No Transportation Needs (12/13/2016)   PRAPARE - Administrator, Civil Service (Medical): No    Lack of Transportation (Non-Medical): No  Physical Activity: Insufficiently Active (12/13/2016)   Exercise Vital Sign    Days of Exercise per Week: 3 days    Minutes of Exercise per Session: 30 min  Stress: Stress Concern Present (12/13/2016)   [employer] of Occupational Health - Occupational Stress Questionnaire    Feeling of Stress : To some extent  Social Connections: Socially Integrated (12/13/2016)   Social Connection and Isolation Panel [NHANES]    Frequency of Communication with Friends and Family: Three times a week    Frequency of Social Gatherings with Friends and Family: Twice a week    Attends Religious Services: More than 4 times per year    Active Member of [employer] or Organizations: Yes    Attends Engineer, structural: More than 4 times per year    Marital Status: Married    Allergies:  Allergies  Allergen Reactions   Vicodin [Hydrocodone-Acetaminophen] Nausea And Vomiting    Metabolic Disorder Labs: No results found for: "HGBA1C", "MPG" No results found for: "PROLACTIN" No results found for: "CHOL", "TRIG", "HDL", "CHOLHDL", "VLDL", "LDLCALC" No results found for: "TSH"  Therapeutic Level Labs: No results found for:  "LITHIUM" No results found for: "VALPROATE" No results found for: "CBMZ"  Current Medications: Current Outpatient Medications  Medication Sig Dispense Refill   ARIPiprazole (ABILIFY) 2 MG tablet Take 1 tablet (2 mg total) by mouth daily. 30 tablet 0   ARIPiprazole (ABILIFY) 2 MG tablet Take 1 tablet (2 mg total) by mouth daily. 30 tablet 5   [START ON 09/21/2022] lamoTRIgine (LAMICTAL) 200 MG tablet Take 1 tablet (200 mg total) by mouth daily. 30 tablet 3   tiZANidine (ZANAFLEX) 4 MG capsule Take 4 mg by mouth 3 (three) times daily as needed for muscle spasms.  vitamin B-12 (CYANOCOBALAMIN) 500 MCG tablet Take 1,000 mcg by mouth.     No current facility-administered medications for this visit.     Musculoskeletal: Strength & Muscle Tone: within normal limits Gait & Station: normal Patient leans: N/A  Psychiatric Specialty Exam: Review of Systems  Psychiatric/Behavioral: Negative.    All other systems reviewed and are negative.   Blood pressure (!) 147/91, pulse 84, weight 245 lb 3.2 oz (111.2 kg).Body mass index is 31.7 kg/m.  General Appearance: Casual  Eye Contact:  Good  Speech:  Clear and Coherent  Volume:  Normal  Mood:   good  Affect:  Appropriate, Congruent, and calm  Thought Process:  Coherent  Orientation:  Full (Time, Place, and Person)  Thought Content: Logical   Suicidal Thoughts:  No  Homicidal Thoughts:  No  Memory:  Immediate;   Good  Judgement:  Good  Insight:  Good  Psychomotor Activity:  Normal  Concentration:  Concentration: Good and Attention Span: Good  Recall:  Good  Fund of Knowledge: Good  Language: Good  Akathisia:  No  Handed:  Right  AIMS (if indicated): not done  Assets:  Communication Skills Desire for Improvement  ADL's:  Intact  Cognition: WNL  Sleep:  Good   Screenings: GAD-7    Flowsheet Row Office Visit from 01/28/2022 in [facility] Office Visit from 05/28/2021 in [facility]  Total GAD-7 Score 3 4      PHQ2-9    Flowsheet Row Office Visit from 09/06/2022 in [facility] Office Visit from 01/28/2022 in [facility] Video Visit from 07/21/2021 in [facility] Office Visit from 05/28/2021 in [facility]  PHQ-2 Total Score 0 0 0 2  PHQ-9 Total Score -- -- 0 5      Flowsheet Row Office Visit from 09/06/2022 in [facility] Office Visit from 01/28/2022 in [facility]  C-SSRS RISK CATEGORY Error: Question 1 not populated No Risk        Assessment and Plan:  Jeffrey Hopkins is a 52 y.o. year old male with a history of bipolar disorder, hyperlipidemia vitamin B 12 deficiency,  who presents for follow up appointment for below.   1. Bipolar disorder in full remission, most recent episode unspecified type [facility]) He denies any significant mood symptoms since the last visit.  There is a concern of consistent weight gain.  It is discussed with the patient regarding possible option of discontinuation of Abilify or switching to another antipsychotics or uptitration lamotrigine to mitigate possible risk of weight gain, although there is also a risk of weight gain from lamotrigine.  He acknowledged these options and will first discuss them with his primary care provider to address the issue.  It is also discussed with the patient to ensure obtaining EKG to monitor QTc prolongation.   He prefers his medication to be managed by his primary care provider moving forward. He agrees to discuss this with his primary care provider to determine if it's feasible. A follow-up appointment has been scheduled to ensure continuity of care.   Plan Continue lamotrigine 200 mg daily Continue Abilify 2 mg daily - monitor weight gain Obtain  ROI to get 2- way conversation with his primary care provider Next appointment: 12/16 at 3 PM, in person (he will transfer the care to his primary care  provider if the doctor agrees with this)   The patient demonstrates the following risk factors for suicide: Chronic risk factors for suicide include: psychiatric disorder of bipolar disorder . Acute risk factors for suicide include: N/A. Protective factors for this patient include: positive social support, responsibility to others (children, family), coping skills, and hope for the future. Considering these factors, the overall suicide risk at this point appears to be low. Patient is appropriate for outpatient follow up.     Collaboration of Care: Collaboration of Care: Other reviewed notes in Epic  Patient/Guardian was advised Release of Information must be obtained prior to any record release in order to collaborate their care with an outside provider. Patient/Guardian was advised if they have not already done so to contact the registration department to sign all necessary forms in order for [place] to release information regarding their care.   Consent: Patient/Guardian gives verbal consent for treatment and assignment of benefits for services provided during this visit. Patient/Guardian expressed understanding and agreed to proceed.   The duration of the time spent on the following activities on the date of the encounter was 30 minutes.   Preparing to see the patient (e.g., review of test, records)  Obtaining and/or reviewing separately obtained history  Performing a medically necessary exam and/or evaluation  Counseling and educating the patient/family/caregiver  Ordering medications, tests, or procedures  Documenting clinical information in the electronic or paper health record  Care coordination (when not reported separately)   Neysa Hotter, MD 09/06/2022, 5:47 PM

## 2022-09-06 ENCOUNTER — Encounter: Payer: Self-pay | Admitting: Psychiatry

## 2022-09-06 ENCOUNTER — Ambulatory Visit: Payer: BC Managed Care – PPO | Admitting: Psychiatry

## 2022-09-06 VITALS — BP 147/91 | HR 84 | Wt 245.2 lb

## 2022-09-06 DIAGNOSIS — G4700 Insomnia, unspecified: Secondary | ICD-10-CM

## 2022-09-06 DIAGNOSIS — F3170 Bipolar disorder, currently in remission, most recent episode unspecified: Secondary | ICD-10-CM

## 2022-09-06 MED ORDER — LAMOTRIGINE 200 MG PO TABS
200.0000 mg | ORAL_TABLET | Freq: Every day | ORAL | 3 refills | Status: DC
Start: 2022-09-21 — End: 2022-12-06

## 2022-09-06 NOTE — Patient Instructions (Signed)
Continue lamotrigine 200 mg daily Continue Abilify 2 mg daily Next appointment: 12/16 at 3 PM

## 2022-09-15 ENCOUNTER — Other Ambulatory Visit: Payer: Self-pay | Admitting: Psychiatry

## 2022-09-15 DIAGNOSIS — F3170 Bipolar disorder, currently in remission, most recent episode unspecified: Secondary | ICD-10-CM

## 2022-09-15 NOTE — Telephone Encounter (Signed)
Called patient no answer left voicemail to make aware of Rx has been filled and is waiting to be picked at his pharmacy

## 2022-09-15 NOTE — Telephone Encounter (Signed)
Called pharmacy spoke to [place] he stated that the medication is filled and ready to be picked up the patient sent the electronic request with an old Rx number.

## 2022-09-15 NOTE — Telephone Encounter (Signed)
Noted. Please contact the patient regarding the above. Thanks!

## 2022-09-15 NOTE — Telephone Encounter (Signed)
Could you contact the pharmacy, and notify the the order has been sent the last week for a total of 120 days refill? thanks

## 2022-11-03 NOTE — Telephone Encounter (Signed)
Could you contact if any sooner availability for in person visit, 30 mins? Please also place in the waiting list.

## 2022-11-08 ENCOUNTER — Ambulatory Visit: Payer: BC Managed Care – PPO | Admitting: Psychiatry

## 2022-11-08 ENCOUNTER — Encounter: Payer: Self-pay | Admitting: Psychiatry

## 2022-11-08 VITALS — BP 146/91 | HR 74 | Temp 97.7°F | Ht 73.5 in | Wt 242.8 lb

## 2022-11-08 DIAGNOSIS — Z79899 Other long term (current) drug therapy: Secondary | ICD-10-CM | POA: Diagnosis not present

## 2022-11-08 DIAGNOSIS — F3132 Bipolar disorder, current episode depressed, moderate: Secondary | ICD-10-CM | POA: Diagnosis not present

## 2022-11-08 NOTE — Patient Instructions (Signed)
Continue lamotrigine 200 mg daily Obtain EKG (Obtain EKG - please call [phone] for an appointment) Start ziprasidone 20 mg twice a day after reviewing EKG. Until then, continue Abilify 2 mg daily   Next appointment on 11/11

## 2022-11-08 NOTE — Progress Notes (Signed)
BH MD/PA/NP OP Progress Note  11/08/2022 10:46 AM Jeffrey Hopkins  MRN:  [id number]  Chief Complaint:  Chief Complaint  Patient presents with   Follow-up   HPI:  This is a follow-up appointment for bipolar disorder.  This appointment was made sooner due to him experiencing worsening in his mood symptoms.  He states that he thinks he has cyclothymia.  He and his family believes he is experiencing what is indicated in the website.  Although he has never experienced major depressive episode, he does not experience more "mania."  He has been feeling irritable, and is racing to get things done at work with inflated sense of self.  He lashes out, and has not had much communication with others while he does his works.  He states that how he was doing at his last visit is how he has been doing (he also apologized about his manner during the last encounter). He sleeps up to a few hours.  He has inner voice of him being critical to himself.  He agrees that he tries to get things done to meet his expectation, and then feeling depressed afterwards.  He is also stressed due to new responsibility.  When he is experiencing "mania," he tends to take on more work.  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  He did not interact at all with his students.  He had taken days off, and is hoping to return tomorrow.  He wonders if he can have intermittent FMLA.  He denies any issues with focus except that he is selective listners.  However, when he was taking days off, he was asleep for the entire day. The patient has mood symptoms as in PHQ-9/GAD-7.  Although he has thoughts of passive SI, he has never had any plan or intent.  He agrees to contact emergency resources if any worsening.  He denies hallucinations, HI.  He is seeing a therapist through [employer].  He agrees to find a therapist through her insurance to see the one long term.   Substance use  Tobacco  Alcohol Other substances/  Current  24 beers per week- he drank only a few the last few days as an effort to cut down. He denies physical symptoms, but has mental craving denies  Past  Drinks 8 beers per week denies  Past Treatment        Wt Readings from Last 3 Encounters:  11/08/22 242 lb 12.8 oz (110.1 kg)  09/06/22 245 lb 3.2 oz (111.2 kg)  01/28/22 229 lb (103.9 kg)     Household:  wife, one of his daughter Marital status: married for more than 20 years Number of children: 2 daughters Employment: Runner, broadcasting/film/video in high school, Runner, broadcasting/film/video  Visit Diagnosis:    ICD-10-CM   1. Bipolar affective disorder, currently depressed, moderate (HCC)  F31.32     2. High risk medication use  Z79.899 EKG 12-Lead      Past Psychiatric History: Please see initial evaluation for full details. I have reviewed the history. No updates at this time.     Past Medical History:  Past Medical History:  Diagnosis Date   Anxiety    Bipolar disorder (HCC)    Depression    Kidney stone     Past Surgical History:  Procedure Laterality Date   none      Family Psychiatric History: Please see initial evaluation for full details. I  have reviewed the history. No updates at this time.    Family History:  Family History  Problem Relation Age of Onset   Skin cancer Mother    Anxiety disorder Father    Depression Sister    Allergies Sister    Bipolar disorder Maternal Uncle    Colon cancer Paternal Grandfather    Kidney disease Neg Hx    Prostate cancer Neg Hx     Social History:  Social History   Socioeconomic History   Marital status: Married    Spouse name: JENNIFER   Number of children: 2   Years of education: Not on file   Highest education level: Master's degree (e.g., MA, MS, MEng, MEd, MSW, MBA)  Occupational History    Comment: FULLTIME  Tobacco Use   Smoking status: Never   Smokeless tobacco: Never  Vaping Use   Vaping status: Never Used  Substance and Sexual Activity    Alcohol use: Yes    Alcohol/week: 6.0 standard drinks of alcohol    Types: 6 Cans of beer per week   Drug use: No   Sexual activity: Yes    Birth control/protection: None  Other Topics Concern   Not on file  Social History Narrative   Not on file   Social Determinants of Health   Financial Resource Strain: Low Risk  (12/13/2016)   Overall Financial Resource Strain (CARDIA)    Difficulty of Paying Living Expenses: Not hard at all  Food Insecurity: No Food Insecurity (12/13/2016)   Hunger Vital Sign    Worried About Running Out of Food in the Last Year: Never true    Ran Out of Food in the Last Year: Never true  Transportation Needs: No Transportation Needs (12/13/2016)   PRAPARE - Administrator, Civil Service (Medical): No    Lack of Transportation (Non-Medical): No  Physical Activity: Insufficiently Active (12/13/2016)   Exercise Vital Sign    Days of Exercise per Week: 3 days    Minutes of Exercise per Session: 30 min  Stress: Stress Concern Present (12/13/2016)   [employer] of Occupational Health - Occupational Stress Questionnaire    Feeling of Stress : To some extent  Social Connections: Socially Integrated (12/13/2016)   Social Connection and Isolation Panel [NHANES]    Frequency of Communication with Friends and Family: Three times a week    Frequency of Social Gatherings with Friends and Family: Twice a week    Attends Religious Services: More than 4 times per year    Active Member of [employer] or Organizations: Yes    Attends Engineer, structural: More than 4 times per year    Marital Status: Married    Allergies:  Allergies  Allergen Reactions   Vicodin [Hydrocodone-Acetaminophen] Nausea And Vomiting    Metabolic Disorder Labs: No results found for: "HGBA1C", "MPG" No results found for: "PROLACTIN" No results found for: "CHOL", "TRIG", "HDL", "CHOLHDL", "VLDL", "LDLCALC" No results found for: "TSH"  Therapeutic Level Labs: No  results found for: "LITHIUM" No results found for: "VALPROATE" No results found for: "CBMZ"  Current Medications: Current Outpatient Medications  Medication Sig Dispense Refill   ARIPiprazole (ABILIFY) 2 MG tablet Take 1 tablet (2 mg total) by mouth daily. 30 tablet 0   ARIPiprazole (ABILIFY) 2 MG tablet Take 1 tablet (2 mg total) by mouth daily. 30 tablet 5   lamoTRIgine (LAMICTAL) 200 MG tablet Take 1 tablet (200 mg total) by mouth daily. 30 tablet 3  tiZANidine (ZANAFLEX) 4 MG capsule Take 4 mg by mouth 3 (three) times daily as needed for muscle spasms.     vitamin B-12 (CYANOCOBALAMIN) 500 MCG tablet Take 1,000 mcg by mouth.     No current facility-administered medications for this visit.     Musculoskeletal: Strength & Muscle Tone: within normal limits Gait & Station: normal Patient leans: N/A  Psychiatric Specialty Exam: Review of Systems  Psychiatric/Behavioral:  Positive for dysphoric mood, sleep disturbance and suicidal ideas. Negative for agitation, behavioral problems, confusion, decreased concentration, hallucinations and self-injury. The patient is nervous/anxious. The patient is not hyperactive.   All other systems reviewed and are negative.   Blood pressure (!) 146/91, pulse 74, temperature 97.7 F (36.5 C), temperature source Skin, height 6' 1.5" (1.867 m), weight 242 lb 12.8 oz (110.1 kg).Body mass index is 31.6 kg/m.  General Appearance: Well Groomed  Eye Contact:  Good  Speech:  Clear and Coherent  Volume:  Normal  Mood:  Irritable  Affect:  Appropriate, Congruent, and slightly tense, calm  Thought Process:  Coherent  Orientation:  Full (Time, Place, and Person)  Thought Content: Logical   Suicidal Thoughts:  Yes.  without intent/plan  Homicidal Thoughts:  No  Memory:  Immediate;   Good  Judgement:  Good  Insight:  Good  Psychomotor Activity:  Normal, Normal tone, no rigidity, no resting/postural tremors, no tardive dyskinesia    Concentration:   Concentration: Good and Attention Span: Good  Recall:  Good  Fund of Knowledge: Good  Language: Good  Akathisia:  No  Handed:  Right  AIMS (if indicated): not done  Assets:  Communication Skills Desire for Improvement  ADL's:  Intact  Cognition: WNL  Sleep:  Fair   Screenings: GAD-7    Flowsheet Row Office Visit from 01/28/2022 in [facility] Office Visit from 05/28/2021 in [facility]  Total GAD-7 Score 3 4      PHQ2-9    Flowsheet Row Office Visit from 11/08/2022 in [facility] Office Visit from 09/06/2022 in [facility] Office Visit from 01/28/2022 in [facility] Video Visit from 07/21/2021 in [facility] Office Visit from 05/28/2021 in [facility]  PHQ-2 Total Score 4 0 0 0 2  PHQ-9 Total Score 16 -- -- 0 5      Flowsheet Row Office Visit from 11/08/2022 in [facility] Office Visit from 09/06/2022 in [facility] Office Visit from 01/28/2022 in [facility]  C-SSRS RISK CATEGORY Error: Q3, 4, or 5 should not be populated when Q2 is No Error: Question 1 not populated No Risk        Assessment and Plan:  Jaylene Schrom is a 52 y.o. year old male with a history of bipolar disorder, hyperlipidemia vitamin B 12 deficiency,  who presents for follow up appointment for below.     1. Bipolar affective disorder, currently depressed, moderate (HCC) R/o rapid cycling R/o mixed features R/o cyclothymia There has been significant worsening in hypomanic episodes (decreased need for sleep, irritability, racing thoughts with marked anxiety), followed by depressive symptoms since the last visit.   According to the patient, he has been experiencing more of hypomanic symptoms in the last two years, although he has never experienced "major depressive" episode.  Given there is a concern  of weight gain from Abilify, we will try switching to Geodon to target the symptoms with hopefully less risk of weight gain. Discussed potential risk of metabolic side effect, QTc prolongation, EPS.  Will continue current dose of lamotrigine at this time to target bipolar disorder.  Although he may benefit from lithium/Depakote in the future, will hold these at this time due to possible risk of weight gain. He agrees to first obtain EKG. He is also advised to contact his insurance company to find his network therapist.   # Alcohol use He reports escalated use of alcohol for the last week to numb his feelings.  He has been able to cut down the amount.  Continue motivational interview.   Plan Continue lamotrigine 200 mg daily Obtain EKG (Obtain EKG - please call [phone] for an appointment) Start ziprasidone 20 mg twice a day after reviewing EKG. Until then, continue Abilify 2 mg daily   Next appointment on 11/11  He would like FMLA to be filled. Intermittent leave, 3 days per episode, 3 times per month. He agrees to contact HR to send the necessary paperwork  I support intermittent FMLA for him. During flare-ups of his episodes, he experiences significant irritability, which affects his ability to communicate and cooperate with others. Additionally, there is a risk of worsening mood symptoms when his need for sleep decreases, which can be detrimental given the nature of his condition.   The patient demonstrates the following risk factors for suicide: Chronic risk factors for suicide include: psychiatric disorder of bipolar disorder . Acute risk factors for suicide include: N/A. Protective factors for this patient include: positive social support, responsibility to others (children, family), coping skills, and hope  for the future. Considering these factors, the overall suicide risk at this point appears to be low. Patient is appropriate for outpatient follow up.     Collaboration of Care: Collaboration of Care: Other reviewed notes in Epic  Patient/Guardian was advised Release of Information must be obtained prior to any record release in order to collaborate their care with an outside provider. Patient/Guardian was advised if they have not already done so to contact the registration department to sign all necessary forms in order for [place] to release information regarding their care.   Consent: Patient/Guardian gives verbal consent for treatment and assignment of benefits for services provided during this visit. Patient/Guardian expressed understanding and agreed to proceed.   The duration of the time spent on the following activities on the date of the encounter was 40 minutes.   Preparing to see the patient (e.g., review of test, records)  Obtaining and/or reviewing separately obtained history  Performing a medically necessary exam and/or evaluation  Counseling and educating the patient/family/caregiver  Ordering medications, tests, or procedures  Referring and communicating with other healthcare professionals (when not reported separately)  Documenting clinical information in the electronic or paper health record  Independently interpreting results of tests/labs and communication of results to the family or caregiver  Care coordination (when not reported separately)   Neysa Hotter, MD 11/08/2022, 10:46 AM

## 2022-11-11 ENCOUNTER — Ambulatory Visit
Admission: RE | Admit: 2022-11-11 | Discharge: 2022-11-11 | Disposition: A | Discharge status: Home / Self Care | Payer: BC Managed Care – PPO | Source: Ambulatory Visit | Attending: Psychiatry | Admitting: Psychiatry

## 2022-11-11 DIAGNOSIS — Z0389 Encounter for observation for other suspected diseases and conditions ruled out: Secondary | ICD-10-CM | POA: Insufficient documentation

## 2022-11-11 DIAGNOSIS — Z79899 Other long term (current) drug therapy: Secondary | ICD-10-CM | POA: Insufficient documentation

## 2022-11-15 ENCOUNTER — Other Ambulatory Visit: Payer: Self-pay | Admitting: Psychiatry

## 2022-11-15 MED ORDER — ZIPRASIDONE HCL 20 MG PO CAPS
20.0000 mg | ORAL_CAPSULE | Freq: Two times a day (BID) | ORAL | 0 refills | Status: DC
Start: 2022-11-15 — End: 2022-12-06

## 2022-11-15 NOTE — Telephone Encounter (Signed)
Reviewed EKG. QTc EKG 422 msec, hr 75 - please advise him to start ziprasidone 20 mg twice a day, and discontinue Abilify as we discussed at his last visit. Order was sent to the pharmacy.

## 2022-12-01 NOTE — Progress Notes (Signed)
BH MD/PA/NP OP Progress Note  12/06/2022 11:55 AM Jeffrey Hopkins  MRN:  [id number]  Chief Complaint:  Chief Complaint  Patient presents with   Follow-up   HPI:  - ziprasidone was started since the last visit.   This is a follow-up appointment for bipolar disorder.  He states that he has been doing good, and calm. His thoughts are not as busy as it was. He has been able to stay on tasks without being bothered by his thoughts.  He has started to set boundaries at work.  He has not missed any work since starting [employer].  He and his boss discussed about this, and he decided not to pursue FMLA at this time. He will be going to a beach trip with his two daughters, who will be back home for Thanksgiving. His wife thinks he has been doing better. He denies feeling depressed.  He sleeps 6 hours, and he feels refreshed.  He denies change in appetite.  He has been taking a walk every day, and feels good about weight loss.  He denies SI.  He denies decreased need for sleep, euphoria, irritability.  He feels comfortable to stay on the current medication regimen.   Household:  wife  Marital status: married for more than 20 years Number of children: 2 daughters Employment: Runner, broadcasting/film/video in high school, Runner, broadcasting/film/video  Substance use   Tobacco Alcohol Other substances/  Current   A couple of shots, twice a week,   Used to drink 24 beers per week- he drank only a few the last few days as an effort to cut down. He denies physical symptoms, but has mental craving denies  Past   Drinks 8 beers per week denies  Past Treatment             Wt Readings from Last 3 Encounters:  12/06/22 241 lb (109.3 kg)  11/08/22 242 lb 12.8 oz (110.1 kg)  09/06/22 245 lb 3.2 oz (111.2 kg)     Visit Diagnosis:    ICD-10-CM   1. Bipolar disorder, in partial remission, most recent episode mixed (HCC)  F31.77       Past Psychiatric History: Please see initial evaluation for full details. I have reviewed the history. No  updates at this time.     Past Medical History:  Past Medical History:  Diagnosis Date   Anxiety    Bipolar disorder (HCC)    Depression    Kidney stone     Past Surgical History:  Procedure Laterality Date   none      Family Psychiatric History: Please see initial evaluation for full details. I have reviewed the history. No updates at this time.     Family History:  Family History  Problem Relation Age of Onset   Skin cancer Mother    Anxiety disorder Father    Depression Sister    Allergies Sister    Bipolar disorder Maternal Uncle    Colon cancer Paternal Grandfather    Kidney disease Neg Hx    Prostate cancer Neg Hx     Social History:  Social History   Socioeconomic History   Marital status: Married    Spouse name: JENNIFER   Number of children: 2   Years of education: Not on file   Highest education level: Master's degree (e.g., MA, MS, MEng, MEd, MSW, MBA)  Occupational History    Comment: FULLTIME  Tobacco Use   Smoking status: Never   Smokeless tobacco: Never  Vaping  Use   Vaping status: Never Used  Substance and Sexual Activity   Alcohol use: Yes    Alcohol/week: 6.0 standard drinks of alcohol    Types: 6 Cans of beer per week   Drug use: No   Sexual activity: Yes    Birth control/protection: None  Other Topics Concern   Not on file  Social History Narrative   Not on file   Social Determinants of Health   Financial Resource Strain: Low Risk  (11/08/2022)   [facility] from [facility]   Overall Financial Resource Strain (CARDIA)    Difficulty of Paying Living Expenses: Not hard at all  Food Insecurity: No Food Insecurity (11/08/2022)   Received from [facility]   Hunger Vital Sign    Worried About Running Out of Food in the Last Year: Never true    Ran Out of Food in the Last Year: Never true  Transportation Needs: No Transportation Needs (11/08/2022)   Received from [facility] - Transportation    In the past 12 months, has lack of transportation kept you from medical appointments or from getting medications?: No    Lack of Transportation (Non-Medical): No  Physical Activity: Insufficiently Active (12/13/2016)   Exercise Vital Sign    Days of Exercise per Week: 3 days    Minutes of Exercise per Session: 30 min  Stress: Stress Concern Present (12/13/2016)   [employer] of Occupational Health - Occupational Stress Questionnaire    Feeling of Stress : To some extent  Social Connections: Socially Integrated (12/13/2016)   Social Connection and Isolation Panel [NHANES]    Frequency of Communication with Friends and Family: Three times a week    Frequency of Social Gatherings with Friends and Family: Twice a week    Attends Religious Services: More than 4 times per year    Active Member of [employer] or Organizations: Yes    Attends Engineer, structural: More than 4 times per year    Marital Status: Married    Allergies:  Allergies  Allergen Reactions   Vicodin [Hydrocodone-Acetaminophen] Nausea And Vomiting    Metabolic Disorder Labs: No results found for: "HGBA1C", "MPG" No results found for: "PROLACTIN" No results found for: "CHOL", "TRIG", "HDL", "CHOLHDL", "VLDL", "LDLCALC" No results found for: "TSH"  Therapeutic Level Labs: No results found for: "LITHIUM" No results found for: "VALPROATE" No results found for: "CBMZ"  Current Medications: Current Outpatient Medications  Medication Sig Dispense Refill   lamoTRIgine (LAMICTAL) 200 MG tablet Take 1 tablet (200 mg total) by mouth daily. 90 tablet 0   tiZANidine (ZANAFLEX) 4 MG capsule Take 4 mg by mouth 3 (three) times daily as needed for muscle spasms.     vitamin B-12 (CYANOCOBALAMIN) 500 MCG tablet Take 1,000 mcg by mouth.     [START ON 12/15/2022] ziprasidone (GEODON) 20 MG capsule Take 1 capsule (20 mg total) by mouth 2 (two) times daily with a meal. 180 capsule 0   No  current facility-administered medications for this visit.     Musculoskeletal: Strength & Muscle Tone: within normal limits Gait & Station: normal Patient leans: N/A  Psychiatric Specialty Exam: Review of Systems  Blood pressure 119/84, pulse 69, temperature 98 F (36.7 C), temperature source Skin, height 6' 1.5" (1.867 m), weight 241 lb (109.3 kg).Body mass index is 31.36 kg/m.  General Appearance: Well Groomed  Eye Contact:  Good  Speech:  Clear and Coherent  Volume:  Normal  Mood:   good  Affect:  Appropriate, Congruent, and Full Range  Thought Process:  Coherent  Orientation:  Full (Time, Place, and Person)  Thought Content: Logical   Suicidal Thoughts:  No  Homicidal Thoughts:  No  Memory:  Immediate;   Good  Judgement:  Good  Insight:  Good  Psychomotor Activity:  Normal  Concentration:  Concentration: Good and Attention Span: Good  Recall:  Good  Fund of Knowledge: Good  Language: Good  Akathisia:  No  Handed:  Right  AIMS (if indicated): not done  Assets:  Communication Skills Desire for Improvement  ADL's:  Intact  Cognition: WNL  Sleep:  Good   Screenings: GAD-7    Flowsheet Row Office Visit from 01/28/2022 in [facility] Office Visit from [facility]28/2021 [facility]  Total GAD-7 Score 3 4      PHQ2-9    Flowsheet Row Office Visit from 11/08/2022 in [facility] Office Visit from 09/06/2022 in [facility] Office Visit from 01/28/2022 in [facility] Video Visit from 07/21/2021 in [facility] Office Visit from 05/28/2021 in [facility]  PHQ-2 Total Score 4 0 0 0 2  PHQ-9 Total Score 16 -- -- 0 5      Flowsheet Row Office Visit from 11/08/2022 in [facility] Office Visit from 09/06/2022 in [facility] Office Visit from 01/28/2022 in [facility]  C-SSRS RISK CATEGORY Error: Q3, 4, or 5 should not be populated when Q2 is No Error: Question 1 not populated No Risk        Assessment and Plan:  Sherley Vivo is a 52 y.o. year old male with a history of bipolar disorder, hyperlipidemia vitamin B 12 deficiency,  who presents for follow up appointment for below.    1. Bipolar disorder, in partial remission, most recent episode mixed (HCC) R/o rapid cycling R/o mixed features R/o cyclothymia There has been significant improvement in hypomanic symptoms, anxiety, depressive symptoms since starting switching from Abilify to ziprasidone.  He reports good relationship with his family, setting boundaries at work, and has been working on regular exercise.  Will continue current dose to target bipolar disorder along with lamotrigine.    # Alcohol use He has cut down alcohol use, and denies any craving.  Will continue to assess and intervene as needed.   Plan Continue lamotrigine 200 mg daily Continue ziprasidone 20 mg twice a day (EKG QTc 422 msec, HR 75 NSR) Next appointment on 1/6 at 4:30. IP  The patient demonstrates the following risk factors for suicide: Chronic risk factors for suicide include: psychiatric disorder of bipolar disorder . Acute risk factors for suicide include: N/A. Protective factors for this patient include: positive social support, responsibility to others (children, family), coping skills, and hope for the future. Considering these factors, the overall suicide risk at this point appears to be low. Patient is appropriate for outpatient follow up.     Collaboration of Care: Collaboration of Care: Other reviewed notes in Epic  Patient/Guardian was advised Release of Information must be obtained prior to any record release in order to  collaborate their care with an outside provider. Patient/Guardian was advised if they have not already done so to contact the registration department to sign all necessary forms in  order for [place] to release information regarding their care.   Consent: Patient/Guardian gives verbal consent for treatment and assignment of benefits for services provided during this visit. Patient/Guardian expressed understanding and agreed to proceed.    Neysa Hotter, MD 12/06/2022, 11:55 AM

## 2022-12-06 ENCOUNTER — Encounter: Payer: Self-pay | Admitting: Psychiatry

## 2022-12-06 ENCOUNTER — Ambulatory Visit: Payer: BC Managed Care – PPO | Admitting: Psychiatry

## 2022-12-06 VITALS — BP 119/84 | HR 69 | Temp 98.0°F | Ht 73.5 in | Wt 241.0 lb

## 2022-12-06 DIAGNOSIS — F3177 Bipolar disorder, in partial remission, most recent episode mixed: Secondary | ICD-10-CM

## 2022-12-06 MED ORDER — LAMOTRIGINE 200 MG PO TABS: 200.0000 mg | ORAL_TABLET | Freq: Every day | ORAL | 0 refills | Status: DC

## 2022-12-06 MED ORDER — ZIPRASIDONE HCL 20 MG PO CAPS: 20.0000 mg | ORAL_CAPSULE | Freq: Two times a day (BID) | ORAL | 0 refills | Status: DC

## 2023-01-06 ENCOUNTER — Ambulatory Visit: Payer: BC Managed Care – PPO | Admitting: Psychiatry

## 2023-01-08 ENCOUNTER — Other Ambulatory Visit: Payer: Self-pay | Admitting: Psychiatry

## 2023-01-08 DIAGNOSIS — F3170 Bipolar disorder, currently in remission, most recent episode unspecified: Secondary | ICD-10-CM

## 2023-01-11 MED ORDER — TRAZODONE HCL 50 MG PO TABS: 25.0000 mg | ORAL_TABLET | Freq: Every evening | ORAL | 0 refills | Status: DC | PRN

## 2023-01-11 NOTE — Telephone Encounter (Signed)
I called and left a voice message. A MyChart message will be sent in response to his inquiry.

## 2023-01-23 NOTE — Progress Notes (Signed)
 [facility] MD/PA/NP OP Progress Note  01/31/2023 5:20 PM Jeffrey Hopkins  MRN:  [id number]  Chief Complaint:  Chief Complaint  Patient presents with   Follow-up   HPI:  This is a follow-up appointment for bipolar disorder and insomnia.  He states that he has been doing well except initial insomnia.  Although he is fine right after he is able to sleep, he has been sleeping only 3 hours without decreased need for sleep.  He felt recently from trazodone , and stopped taking it after a few trials.  He has to take a nap up to 2 hours.  He agrees to refrain from this.  It has been going on since mid Dec.  He tends to have thoughts of being worried about consequences of insomnia and variety of thoughts when he has insomnia.  He has been doing well otherwise.  He has a good Christmas with his 2 daughters in college.  He denies feeling depressed or anxiety.  He denies decreased need for sleep, euphoria.    Wt Readings from Last 3 Encounters:  01/31/23 236 lb 9.6 oz (107.3 kg)  12/06/22 241 lb (109.3 kg)  11/08/22 242 lb 12.8 oz (110.1 kg)     Household:  wife  Marital status: married for more than 20 years Number of children: 2 daughters Employment: runner, broadcasting/film/video in high school, runner, broadcasting/film/video   Substance use   Tobacco Alcohol Other substances/  Current   A couple of shots, twice a week,    Used to drink 24 beers per week- he drank only a few the last few days as an effort to cut down. He denies physical symptoms, but has mental craving Denies, one coffee in the morning  Past   Drinks 8 beers per week denies  Past Treatment           Visit Diagnosis:    ICD-10-CM   1. Bipolar disorder, in partial remission, most recent episode mixed (HCC)  F31.77     2. Insomnia, unspecified type  G47.00       Past Psychiatric History: Please see initial evaluation for full details. I have reviewed the history. No updates at this time.     Past Medical History:  Past Medical History:  Diagnosis Date   Anxiety     Bipolar disorder (HCC)    Depression    Kidney stone     Past Surgical History:  Procedure Laterality Date   none      Family Psychiatric History: Please see initial evaluation for full details. I have reviewed the history. No updates at this time.     Family History:  Family History  Problem Relation Age of Onset   Skin cancer Mother    Anxiety disorder Father    Depression Sister    Allergies Sister    Bipolar disorder Maternal Uncle    Colon cancer Paternal Grandfather    Kidney disease Neg Hx    Prostate cancer Neg Hx     Social History:  Social History   Socioeconomic History   Marital status: Married    Spouse name: JENNIFER   Number of children: 2   Years of education: Not on file   Highest education level: Master's degree (e.g., MA, MS, MEng, MEd, MSW, MBA)  Occupational History    Comment: FULLTIME  Tobacco Use   Smoking status: Never   Smokeless tobacco: Never  Vaping Use   Vaping status: Never Used  Substance and Sexual Activity   Alcohol use:  Yes    Alcohol/week: 6.0 standard drinks of alcohol    Types: 6 Cans of beer per week   Drug use: No   Sexual activity: Yes    Birth control/protection: None  Other Topics Concern   Not on file  Social History Narrative   Not on file   Social Drivers of Health   Financial Resource Strain: Low Risk  (11/08/2022)   [facility] from [facility]   Overall Financial Resource Strain (CARDIA)    Difficulty of Paying Living Expenses: Not hard at all  Food Insecurity: No Food Insecurity (11/08/2022)   Received from [facility]   Hunger Vital Sign    Worried About Running Out of Food in the Last Year: Never true    Ran Out of Food in the Last Year: Never true  Transportation Needs: No Transportation Needs (11/08/2022)   Received from [facility]    In the past 12 months, has lack of transportation kept you from medical  appointments or from getting medications?: No    Lack of Transportation (Non-Medical): No  Physical Activity: Insufficiently Active (12/13/2016)   Exercise Vital Sign    Days of Exercise per Week: 3 days    Minutes of Exercise per Session: 30 min  Stress: Stress Concern Present (12/13/2016)   [employer] of Occupational Health - Occupational Stress Questionnaire    Feeling of Stress : To some extent  Social Connections: Socially Integrated (12/13/2016)   Social Connection and Isolation Panel [NHANES]    Frequency of Communication with Friends and Family: Three times a week    Frequency of Social Gatherings with Friends and Family: Twice a week    Attends Religious Services: More than 4 times per year    Active Member of [employer] or Organizations: Yes    Attends Engineer, Structural: More than 4 times per year    Marital Status: Married    Allergies:  Allergies  Allergen Reactions   Vicodin [Hydrocodone-Acetaminophen ] Nausea And Vomiting    Metabolic Disorder Labs: No results found for: HGBA1C, MPG No results found for: PROLACTIN No results found for: CHOL, TRIG, HDL, CHOLHDL, VLDL, LDLCALC No results found for: TSH  Therapeutic Level Labs: No results found for: LITHIUM No results found for: VALPROATE No results found for: CBMZ  Current Medications: Current Outpatient Medications  Medication Sig Dispense Refill   lamoTRIgine  (LAMICTAL ) 200 MG tablet Take 1 tablet (200 mg total) by mouth daily. 90 tablet 0   vitamin B-12 (CYANOCOBALAMIN) 500 MCG tablet Take 1,000 mcg by mouth.     ziprasidone  (GEODON ) 20 MG capsule Take 1 capsule (20 mg total) by mouth 2 (two) times daily with a meal. 180 capsule 0   tiZANidine (ZANAFLEX) 4 MG capsule Take 4 mg by mouth 3 (three) times daily as needed for muscle spasms. (Patient not taking: Reported on 01/31/2023)     No current facility-administered medications for this visit.      Musculoskeletal: Strength & Muscle Tone: within normal limits Gait & Station: normal Patient leans: N/A  Psychiatric Specialty Exam: Review of Systems  Psychiatric/Behavioral:  Positive for sleep disturbance. Negative for agitation, behavioral problems, confusion, decreased concentration, dysphoric mood, hallucinations, self-injury and suicidal ideas. The patient is not nervous/anxious and is not hyperactive.   All other systems reviewed and are negative.   Blood pressure 138/88, pulse 77, temperature (!) 97.3 F (36.3 C), temperature source Temporal, height 6' 1.5 (1.867 m),  weight 236 lb 9.6 oz (107.3 kg), SpO2 96%.Body mass index is 30.79 kg/m.  General Appearance: Well Groomed  Eye Contact:  Good  Speech:  Clear and Coherent  Volume:  Normal  Mood:   good  Affect:  Appropriate, Congruent, and Full Range  Thought Process:  Coherent  Orientation:  Full (Time, Place, and Person)  Thought Content: Logical   Suicidal Thoughts:  No  Homicidal Thoughts:  No  Memory:  Immediate;   Good  Judgement:  Good  Insight:  Good  Psychomotor Activity:  Normal, Normal tone, no rigidity, no resting/postural tremors, no tardive dyskinesia    Concentration:  Concentration: Good and Attention Span: Good  Recall:  Good  Fund of Knowledge: Good  Language: Good  Akathisia:  No  Handed:  Right  AIMS (if indicated): not done  Assets:  Communication Skills Desire for Improvement  ADL's:  Intact  Cognition: WNL  Sleep:  Poor   Screenings: GAD-7    Flowsheet Row Office Visit from 01/28/2022 in [facility] Office Visit from 05/28/2021 in [facility]  Total GAD-7 Score 3 4      PHQ2-9    Flowsheet Row Office Visit from 11/08/2022 in [facility] Office Visit from 09/06/2022 in [facility] Office Visit from 01/28/2022 in [facility] Video Visit from 07/21/2021 in [facility] Office Visit from 05/28/2021 in [facility]  PHQ-2 Total Score 4 0 0 0 2  PHQ-9 Total Score 16 -- -- 0 5      Flowsheet Row Office Visit from 11/08/2022 in [facility] Office Visit from 09/06/2022 in [facility] Office Visit from 01/28/2022 in [facility]  C-SSRS RISK CATEGORY Error: Q3, 4, or 5 should not be populated when Q2 is No Error: Question 1 not populated No Risk        Assessment and Plan:  Sevin Langenbach is a 52 y.o. year old male with a history of bipolar disorder, hyperlipidemia vitamin B 12 deficiency,  who presents for follow up appointment for below.   1. Bipolar disorder, in partial remission, most recent episode mixed (HCC) R/o rapid cycling R/o mixed features R/o cyclothymia Thre has been consistent improvement in hypomanic symptoms, depressive symptoms and anxiety since switching from Abilify  to ziprasidone . He reports good relationship with his family, setting boundaries at work, and has been working on regular exercise.  Will continue ziprasidone  and lamotrigine  to target bipolar disorder.   2. Insomnia, unspecified type Significant worsening in the past few weeks.  He had adverse reaction of jitteriness from trazodone .  He agrees to first take ziprasidone  although in the morning to see if it mitigate the potential risk of insomnia.  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  He expressed understanding of this.    # Alcohol use Improving.  Will continue to assess and intervene as needed.    Plan Continue lamotrigine  200 mg daily Change: ziprasidone  40 mg daily (EKG - NSR, HR 75, QTc 422 msec 10/2022, (Lipid checked  10/2022) Next appointment on 2/24 at 4 PM, IP  Past trials-  trazodone  (jittery), zolpidem  (drowsiness),    The patient demonstrates the following risk factors for suicide: Chronic risk factors for  suicide include: psychiatric disorder of bipolar disorder . Acute risk factors for suicide include: N/A. Protective factors for this patient include: positive social support, responsibility to others (children, family), coping skills, and hope for the future. Considering these factors, the overall suicide risk at this point appears to be low. Patient is appropriate for outpatient follow up.     Collaboration of Care: Collaboration of Care: Other reviewed notes in Epic  Patient/Guardian was advised Release of Information must be obtained prior to any record release in order to collaborate their care with an outside provider. Patient/Guardian was advised if they have not already done so to contact the registration department to sign all necessary forms in order for us  to release information regarding their care.   Consent: Patient/Guardian gives verbal consent for treatment and assignment of benefits for services provided during this visit. Patient/Guardian expressed understanding and agreed to proceed.    Katheren Sleet, MD 01/31/2023, 5:20 PM

## 2023-01-31 ENCOUNTER — Encounter: Payer: Self-pay | Admitting: Psychiatry

## 2023-01-31 ENCOUNTER — Ambulatory Visit (INDEPENDENT_AMBULATORY_CARE_PROVIDER_SITE_OTHER): Payer: 59 | Admitting: Psychiatry

## 2023-01-31 VITALS — BP 138/88 | HR 77 | Temp 97.3°F | Ht 73.5 in | Wt 236.6 lb

## 2023-01-31 DIAGNOSIS — F3177 Bipolar disorder, in partial remission, most recent episode mixed: Secondary | ICD-10-CM | POA: Diagnosis not present

## 2023-01-31 DIAGNOSIS — G4700 Insomnia, unspecified: Secondary | ICD-10-CM

## 2023-01-31 NOTE — Patient Instructions (Signed)
 Continue lamotrigine 200 mg daily Change: ziprasidone 40 mg daily  Next appointment on 2/24 at 4 PM

## 2023-02-09 MED ORDER — DOXEPIN HCL 6 MG PO TABS: 6.0000 mg | ORAL_TABLET | Freq: Every evening | ORAL | 1 refills | Status: DC | PRN

## 2023-03-11 ENCOUNTER — Other Ambulatory Visit: Payer: Self-pay | Admitting: Psychiatry

## 2023-03-18 NOTE — Progress Notes (Signed)
 Virtual Visit via Video Note  I connected with Jeffrey Hopkins on 03/21/23 at  4:00 PM EST by a video enabled telemedicine application and verified that I am speaking with the correct person using two identifiers.  Location: Patient: home Provider: office Persons participated in the visit- patient, provider    I discussed the limitations of evaluation and management by telemedicine and the availability of in person appointments. The patient expressed understanding and agreed to proceed.  I discussed the assessment and treatment plan with the patient. The patient was provided an opportunity to ask questions and all were answered. The patient agreed with the plan and demonstrated an understanding of the instructions.   The patient was advised to call back or seek an in-person evaluation if the symptoms worsen or if the condition fails to improve as anticipated.    Neysa Hotter, MD    [facility] MD/PA/NP OP Progress Note  03/21/2023 4:53 PM Jeffrey Hopkins  MRN:  [id number]  Chief Complaint:  Chief Complaint  Patient presents with   Follow-up   HPI:  This is a follow-up appointment for bipolar disorder, insomnia.  He states that he had to do virtual due to him feeling sick.  He continues to struggle with insomnia.  He was awake until 4 AM, feeling wired, wide awake.  He had never had issues with sleep.  Although he tried to take Geodon in the morning, he did not alleviate insomnia.  He tried doxepin with limited benefit.  He feels anxious due to insomnia.  He is worried about sleep.  He is worried that he will be getting quick temper.  He wakes up with fear that he did not get enough sleep, and needed to watch himself so that he will not get irritable.  He has not been eating as much unless he is hungry.  In retrospect, he thinks he was eating due to irritability when he was on Abilify.  His mood has been good, and is functioning well.  He denies feeling depressed.  He denies SI.  He denies decreased need  for sleep or euphoria.  He has tried to cut down alcohol to see if it helps.  He denies drug use, and continues to drink only a cup of coffee in the morning.  He agrees with the plan as outlined below.    Wt Readings from Last 3 Encounters:  01/31/23 236 lb 9.6 oz (107.3 kg)  12/06/22 241 lb (109.3 kg)  11/08/22 242 lb 12.8 oz (110.1 kg)     Household:  wife  Marital status: married for more than 20 years Number of children: 2 daughters Employment: Runner, broadcasting/film/video in high school, Runner, broadcasting/film/video   Substance use   Tobacco Alcohol Other substances/  Current   A couple of shots, twice a week,    Used to drink 24 beers per week- he drank only a few the last few days as an effort to cut down. He denies physical symptoms, but has mental craving Denies, one coffee in the morning  Past   Drinks 8 beers per week denies  Past Treatment             Visit Diagnosis:    ICD-10-CM   1. Bipolar disorder, in partial remission, most recent episode mixed (HCC)  F31.77     2. Insomnia, unspecified type  G47.00       Past Psychiatric History: Please see initial evaluation for full details. I have reviewed the history. No updates at this time.  Past Medical History:  Past Medical History:  Diagnosis Date   Anxiety    Bipolar disorder (HCC)    Depression    Kidney stone     Past Surgical History:  Procedure Laterality Date   none      Family Psychiatric History: Please see initial evaluation for full details. I have reviewed the history. No updates at this time.     Family History:  Family History  Problem Relation Age of Onset   Skin cancer Mother    Anxiety disorder Father    Depression Sister    Allergies Sister    Bipolar disorder Maternal Uncle    Colon cancer Paternal Grandfather    Kidney disease Neg Hx    Prostate cancer Neg Hx     Social History:  Social History   Socioeconomic History   Marital status: Married    Spouse name: JENNIFER   Number of children: 2    Years of education: Not on file   Highest education level: Master's degree (e.g., MA, MS, MEng, MEd, MSW, MBA)  Occupational History    Comment: FULLTIME  Tobacco Use   Smoking status: Never   Smokeless tobacco: Never  Vaping Use   Vaping status: Never Used  Substance and Sexual Activity   Alcohol use: Yes    Alcohol/week: 6.0 standard drinks of alcohol    Types: 6 Cans of beer per week   Drug use: No   Sexual activity: Yes    Birth control/protection: None  Other Topics Concern   Not on file  Social History Narrative   Not on file   Social Drivers of Health   Financial Resource Strain: Low Risk  (11/08/2022)   Received from [facility]   Overall Financial Resource Strain (CARDIA)    Difficulty of Paying Living Expenses: Not hard at all  Food Insecurity: No Food Insecurity (11/08/2022)   [facility] from [facility]   Hunger Vital Sign    Worried About Running Out of Food in the Last Year: Never true    Ran Out of Food in the Last Year: Never true  Transportation Needs: No Transportation Needs (11/08/2022)   Received from [facility]    In the past 12 months, has lack of transportation kept you from medical appointments or from getting medications?: No    Lack of Transportation (Non-Medical): No  Physical Activity: Insufficiently Active (12/13/2016)   Exercise Vital Sign    Days of Exercise per Week: 3 days    Minutes of Exercise per Session: 30 min  Stress: Stress Concern Present (12/13/2016)   [employer] of Occupational Health - Occupational Stress Questionnaire    Feeling of Stress : To some extent  Social Connections: Socially Integrated (12/13/2016)   Social Connection and Isolation Panel [NHANES]    Frequency of Communication with Friends and Family: Three times a week    Frequency of Social Gatherings with Friends and Family: Twice a week    Attends Religious Services: More than  4 times per year    Active Member of [employer] or Organizations: Yes    Attends Engineer, structural: More than 4 times per year    Marital Status: Married    Allergies:  Allergies  Allergen Reactions   Vicodin [Hydrocodone-Acetaminophen] Nausea And Vomiting    Metabolic Disorder Labs: No results found for: "HGBA1C", "MPG" No results found for: "PROLACTIN" No results found for: "CHOL", "TRIG", "HDL", "  CHOLHDL", "VLDL", "LDLCALC" No results found for: "TSH"  Therapeutic Level Labs: No results found for: "LITHIUM" No results found for: "VALPROATE" No results found for: "CBMZ"  Current Medications: Current Outpatient Medications  Medication Sig Dispense Refill   lurasidone (LATUDA) 20 MG TABS tablet Take 1 tablet (20 mg total) by mouth daily. 30 tablet 1   [START ON 04/04/2023] lamoTRIgine (LAMICTAL) 200 MG tablet Take 1 tablet (200 mg total) by mouth daily. 90 tablet 0   tiZANidine (ZANAFLEX) 4 MG capsule Take 4 mg by mouth 3 (three) times daily as needed for muscle spasms. (Patient not taking: Reported on 01/31/2023)     vitamin B-12 (CYANOCOBALAMIN) 500 MCG tablet Take 1,000 mcg by mouth.     No current facility-administered medications for this visit.     Musculoskeletal: Strength & Muscle Tone:  N/A Gait & Station:  N/A Patient leans: N/A  Psychiatric Specialty Exam: Review of Systems  Psychiatric/Behavioral:  Positive for sleep disturbance. Negative for agitation, behavioral problems, confusion, decreased concentration, dysphoric mood, hallucinations, self-injury and suicidal ideas. The patient is nervous/anxious. The patient is not hyperactive.   All other systems reviewed and are negative.   There were no vitals taken for this visit.There is no height or weight on file to calculate BMI.  General Appearance: Well Groomed  Eye Contact:  Good  Speech:  Clear and Coherent  Volume:  Normal  Mood:   good  Affect:  Appropriate, Congruent, and Full Range  Thought  Process:  Coherent  Orientation:  Full (Time, Place, and Person)  Thought Content: Logical   Suicidal Thoughts:  No  Homicidal Thoughts:  No  Memory:  Immediate;   Good  Judgement:  Good  Insight:  Good  Psychomotor Activity:  Normal  Concentration:  Concentration: Good and Attention Span: Good  Recall:  Good  Fund of Knowledge: Good  Language: Good  Akathisia:  No  Handed:  Right  AIMS (if indicated): not done  Assets:  Communication Skills Desire for Improvement  ADL's:  Intact  Cognition: WNL  Sleep:  Poor   Screenings: GAD-7    Flowsheet Row Office Visit from 01/28/2022 in [facility] Office Visit from 05/28/2021 in [facility]  Total GAD-7 Score 3 4      PHQ2-9    Flowsheet Row Office Visit from 01/31/2023 in [facility] Office Visit from 11/08/2022 in [facility] Office Visit from 09[facility]06/2022 in [facility] Office Visit from 01/28/2022 in [facility] Video Visit from 07/21/2021 in [facility]  PHQ-2 Total Score 0 4 0 0 0  PHQ-9 Total Score -- 16 -- -- 0      Flowsheet Row Office Visit from 01/31/2023 in [facility] Office Visit from 11/08/2022 in [facility] Office Visit from 09/06/2022 in [facility]  C-SSRS RISK CATEGORY Error: Q3, 4, or 5 should not be populated when Q2 is No Error: Q3, 4, or 5 should not be populated when Q2 is No Error: Question 1 not populated        Assessment and Plan:  Nivin Braniff is a 53 y.o. year old male with a history of bipolar disorder, hyperlipidemia vitamin B 12 deficiency,  who presents for follow up appointment for below.   1. Bipolar  disorder, in partial remission,  most recent episode mixed (HCC) R/o rapid cycling R/o mixed features R/o cyclothymia Although there has been steady improvement in hypomanic, depressive symptoms and anxiety since switching from Abilify to ziprasidone, there is significant worsening in insomnia, which is likely secondary to adverse reaction from ziprasidone.  Will switch to Latuda to see if this mitigates the risk.  Will continue to monitor any metabolic side effect, EPS or QTc prolongation.  Will continue lamotrigine for bipolar disorder.    # Alcohol use Improving.  Will continue to assess and intervene as needed.     Last checked  EKG HR 75, QTc 422 msec 10/2022  Lipid panels LDL 138 10/2022  HbA1c Glu 104 10/2022      Plan Continue lamotrigine 200 mg daily Discontinue ziprasidone  Start Latuda 20 mg daily  Next appointment on 4/21 at 4 PM, IP   Past trials-  trazodone (jittery), zolpidem (drowsiness),  Abilify (limited benefit, concern of increase in appetite), ziprasidone (insomnia)   The patient demonstrates the following risk factors for suicide: Chronic risk factors for suicide include: psychiatric disorder of bipolar disorder . Acute risk factors for suicide include: N/A. Protective factors for this patient include: positive social support, responsibility to others (children, family), coping skills, and hope for the future. Considering these factors, the overall suicide risk at this point appears to be low. Patient is appropriate for outpatient follow up.     Collaboration of Care: Collaboration of Care: Other reviewed notes in Epic  Patient/Guardian was advised Release of Information must be obtained prior to any record release in order to collaborate their care with an outside provider. Patient/Guardian was advised if they have not already done so to contact the registration department to sign all necessary forms in order for [place] to release information regarding their care.    Consent: Patient/Guardian gives verbal consent for treatment and assignment of benefits for services provided during this visit. Patient/Guardian expressed understanding and agreed to proceed.    Neysa Hotter, MD 03/21/2023, 4:53 PM

## 2023-03-21 ENCOUNTER — Telehealth (INDEPENDENT_AMBULATORY_CARE_PROVIDER_SITE_OTHER): Payer: 59 | Admitting: Psychiatry

## 2023-03-21 ENCOUNTER — Encounter: Payer: Self-pay | Admitting: Psychiatry

## 2023-03-21 DIAGNOSIS — G4700 Insomnia, unspecified: Secondary | ICD-10-CM | POA: Diagnosis not present

## 2023-03-21 DIAGNOSIS — F3177 Bipolar disorder, in partial remission, most recent episode mixed: Secondary | ICD-10-CM | POA: Diagnosis not present

## 2023-03-21 MED ORDER — LURASIDONE HCL 20 MG PO TABS: 20.0000 mg | ORAL_TABLET | Freq: Every day | ORAL | 1 refills | Status: DC

## 2023-03-21 MED ORDER — LAMOTRIGINE 200 MG PO TABS: 200.0000 mg | ORAL_TABLET | Freq: Every day | ORAL | 0 refills | Status: DC

## 2023-03-22 ENCOUNTER — Telehealth: Payer: Self-pay

## 2023-03-22 NOTE — Telephone Encounter (Signed)
 PA for patients Lurasidone 20 mg tablets sent to patients insurance was approved  PA case #[id number] Coverage 03-21-23------03-20-26

## 2023-03-22 NOTE — Telephone Encounter (Signed)
 Received fax requesting a Prior Authorization for patients Luraisidone 20 mg tablet initiated via CoverMyMeds pending determination

## 2023-05-08 NOTE — Progress Notes (Signed)
 [facility] MD/PA/NP OP Progress Note  05/16/2023 4:46 PM Jeffrey Hopkins  MRN:  [id number]  Chief Complaint:  Chief Complaint  Patient presents with   Follow-up   HPI:  This is a follow-up appointment for bipolar disorder.  He states that he has been doing well.  He sleeps up to 7-8 hours since being on Latuda .  He does not feel irritable as much since improvement in insomnia.  He had a spring break for a week, and will return next week.  He enjoyed going to a lake with his wife.  He felt relaxed.  His wife also noticed the difference since him been on Latuda . They are not walking on egg shell anymore as his mood has been good.  He occasionally has internal "good voice" to encourage him to do things on the list. He describes it as similar to an obsession, noting that he has been a Type A personality since adulthood. Although he used to have "negative voice"  last fall, it has subsided.  He reflects that he was irritable because he felt others were getting in his way while he was trying to accomplish tasks. He is now able to see the difference between anxiety and mania. He would not stay awake until the middle of the night, trying to complete tasks. He denies change in appetite.  He is aware of his recent blood test results.  There is an upcoming appointment with his primary care about this.  He denies decreased need for sleep or euphoria.  He denies hallucinations.  He denies SI.  He agrees with the plan as outlined below.   Wt Readings from Last 3 Encounters:  05/16/23 229 lb 6.4 oz (104.1 kg)  01/31/23 236 lb 9.6 oz (107.3 kg)  12/06/22 241 lb (109.3 kg)     Blood Pressure 126/94 04/13/2023 1:43 PM EDT   Weight 106.6 kg (235 lb) 04/13/2023 1:43 PM EDT    Wt Readings from Last 3 Encounters:  05/16/23 229 lb 6.4 oz (104.1 kg)  01/31/23 236 lb 9.6 oz (107.3 kg)  12/06/22 241 lb (109.3 kg)     Household:  wife  Marital status: married for more than 20 years Number of children: 2  daughters Employment: Runner, broadcasting/film/video in high school, Runner, broadcasting/film/video, teaching social studies   Substance use   Tobacco Alcohol Other substances/  Current   Vodka, a couple of drinks a week,    A couple of shots, twice a week,    Used to drink 24 beers per week- he drank only a few the last few days as an effort to cut down. He denies physical symptoms, but has mental craving Denies, one coffee in the morning  Past   Drinks 8 beers per week denies  Past Treatment               Visit Diagnosis:    ICD-10-CM   1. Bipolar disorder, in partial remission, most recent episode mixed (HCC)  F31.77     2. High risk medication use  Z79.899       Past Psychiatric History: Please see initial evaluation for full details. I have reviewed the history. No updates at this time.     Past Medical History:  Past Medical History:  Diagnosis Date   Anxiety    Bipolar disorder (HCC)    Depression    Kidney stone     Past Surgical History:  Procedure Laterality Date   none      Family Psychiatric  History: Please see initial evaluation for full details. I have reviewed the history. No updates at this time.     Family History:  Family History  Problem Relation Age of Onset   Skin cancer Mother    Anxiety disorder Father    Depression Sister    Allergies Sister    Bipolar disorder Maternal Uncle    Colon cancer Paternal Grandfather    Kidney disease Neg Hx    Prostate cancer Neg Hx     Social History:  Social History   Socioeconomic History   Marital status: Married    Spouse name: JENNIFER   Number of children: 2   Years of education: Not on file   Highest education level: Master's degree (e.g., MA, MS, MEng, MEd, MSW, MBA)  Occupational History    Comment: FULLTIME  Tobacco Use   Smoking status: Never   Smokeless tobacco: Never  Vaping Use   Vaping status: Never Used  Substance and Sexual Activity   Alcohol use: Yes    Alcohol/week: 6.0 standard drinks of alcohol     Types: 6 Cans of beer per week   Drug use: No   Sexual activity: Yes    Birth control/protection: None  Other Topics Concern   Not on file  Social History Narrative   Not on file   Social Drivers of Health   Financial Resource Strain: Low Risk  (11/08/2022)   Received from [facility]   Overall Financial Resource Strain (CARDIA)    Difficulty of Paying Living Expenses: Not hard at all  Food Insecurity: No Food Insecurity (11/08/2022)   Received from [facility]   Hunger Vital Sign    Worried About Running Out of Food in the Last Year: Never true    Ran Out of Food in the Last Year: Never true  Transportation Needs: No Transportation Needs (11/08/2022)   Received from [facility] - Transportation    In the past 12 months, has lack of transportation kept you from medical appointments or from getting medications?: No    Lack of Transportation (Non-Medical): No  Physical Activity: Insufficiently Active (12/13/2016)   Exercise Vital Sign    Days of Exercise per Week: 3 days    Minutes of Exercise per Session: 30 min  Stress: Stress Concern Present (12/13/2016)   [employer] of Occupational Health - Occupational Stress Questionnaire    Feeling of Stress : To some extent  Social Connections: Socially Integrated (12/13/2016)   Social Connection and Isolation Panel [NHANES]    Frequency of Communication with Friends and Family: Three times a week    Frequency of Social Gatherings with Friends and Family: Twice a week    Attends Religious Services: More than 4 times per year    Active Member of [employer] or Organizations: Yes    Attends Engineer, structural: More than 4 times per year    Marital Status: Married    Allergies:  Allergies  Allergen Reactions   Vicodin [Hydrocodone-Acetaminophen ] Nausea And Vomiting    Metabolic Disorder Labs: No results found for: "HGBA1C", "MPG" No results found for:  "PROLACTIN" No results found for: "CHOL", "TRIG", "HDL", "CHOLHDL", "VLDL", "LDLCALC" No results found for: "TSH"  Therapeutic Level Labs: No results found for: "LITHIUM" No results found for: "VALPROATE" No results found for: "CBMZ"  Current Medications: Current Outpatient Medications  Medication Sig Dispense Refill   lamoTRIgine  (LAMICTAL ) 200 MG tablet Take 1 tablet (200  mg total) by mouth daily. 90 tablet 0   lurasidone  (LATUDA ) 20 MG TABS tablet Take 1 tablet (20 mg total) by mouth daily. 30 tablet 1   tiZANidine (ZANAFLEX) 4 MG capsule Take 4 mg by mouth 3 (three) times daily as needed for muscle spasms.     vitamin B-12 (CYANOCOBALAMIN) 500 MCG tablet Take 1,000 mcg by mouth.     No current facility-administered medications for this visit.     Musculoskeletal: Strength & Muscle Tone: within normal limits Gait & Station: normal Patient leans: N/A  Psychiatric Specialty Exam: Review of Systems  Psychiatric/Behavioral:  Negative for agitation, behavioral problems, confusion, decreased concentration, dysphoric mood, hallucinations, self-injury, sleep disturbance and suicidal ideas. The patient is nervous/anxious. The patient is not hyperactive.   All other systems reviewed and are negative.   Blood pressure 126/84, pulse 97, temperature 99 F (37.2 C), temperature source Temporal, height 6' 1.5" (1.867 m), weight 229 lb 6.4 oz (104.1 kg), SpO2 99%.Body mass index is 29.86 kg/m.  General Appearance: Well Groomed  Eye Contact:  Good  Speech:  Clear and Coherent  Volume:  Normal  Mood:   good  Affect:  Appropriate, Congruent, and Full Range  Thought Process:  Coherent  Orientation:  Full (Time, Place, and Person)  Thought Content: Logical   Suicidal Thoughts:  No  Homicidal Thoughts:  No  Memory:  Immediate;   Good  Judgement:  Good  Insight:  Good  Psychomotor Activity:  Normal, Normal tone, no rigidity, no resting/postural tremors, no tardive dyskinesia     Concentration:  Concentration: Good and Attention Span: Good  Recall:  Good  Fund of Knowledge: Good  Language: Good  Akathisia:  No  Handed:  Right  AIMS (if indicated): 0   Assets:  Communication Skills Desire for Improvement  ADL's:  Intact  Cognition: WNL  Sleep:  Good   Screenings: GAD-7    Flowsheet Row Office Visit from 01/28/2022 in [facility] Office Visit from 05/28/2021 in [facility]  Total GAD-7 Score 3 4      PHQ2-9    Flowsheet Row Office Visit from 01/31/2023 in [facility] Office Visit from 11/08/2022 in [facility] Office Visit from 09/06/2022 in [facility] Office Visit from 01/28/2022 in [facility] Video Visit from 07/21/2021 in [facility]  PHQ-2 Total Score 0 4 0 0 0  PHQ-9 Total Score -- 16 -- -- 0      Flowsheet Row Office Visit from 01/31/2023 in [facility] Office Visit from 11/08/2022 in [facility] Office Visit from 09/06/2022 in [facility]  C-SSRS RISK CATEGORY Error: Q3, 4, or 5 should not be populated when Q2 is No Error: Q3, 4, or 5 should not be populated when Q2 is No Error: Question 1 not populated        Assessment and Plan:  Jeffrey Hopkins is a 53 y.o. year old male with a history of bipolar disorder, hyperlipidemia vitamin B 12 deficiency,  who presents for follow up appointment for below.   1. Bipolar disorder, in partial remission, most recent episode mixed (HCC) R/o rapid cycling R/o mixed features R/o cyclothymia  He denies any hypomanic, depressive symptoms, while he continues to experience occasional "obsessive" thoughts related to anxiety  since switching from  ziprasidone  to Latuda .  He reports significant improvement in insomnia, which was likely secondary to adverse reaction from ziprasidone .  Will continue current dose of Latuda  to target bipolar disorder.  Discussed potential metabolic side effect, EPS and QTc prolongation.  Will continue lamotrigine  for bipolar disorder.   # Alcohol use Improving.  Will continue to assess and intervene as needed.   # high risk medication use There is significant elevation in lipid panels, and he has an upcoming appointment with Dr.Klein.  He expressed understanding of the potential adverse reactions associated with Latuda , though it was considered as a treatment option due to its relatively favorable metabolic profile.  He feels comfortable to stay on Latuda  for now considering his benefit.       Last checked  EKG HR 75, QTc 422 msec 10/2022  Lipid panels TG 438 04/2023  HbA1c Glu 104 10/2022      Plan Continue lamotrigine  200 mg daily Continue Latuda  20 mg daily  Next appointment on 6/17 at 4:30, IP   Past trials-  Abilify  (limited benefit, concern of increase in appetite), ziprasidone  (insomnia), trazodone  (jittery), zolpidem  (drowsiness),     The patient demonstrates the following risk factors for suicide: Chronic risk factors for suicide include: psychiatric disorder of bipolar disorder . Acute risk factors for suicide include: N/A. Protective factors for this patient include: positive social support, responsibility to others (children, family), coping skills, and hope for the future. Considering these factors, the overall suicide risk at this point appears to be low. Patient is appropriate for outpatient follow up.   A total of 30 minutes was spent on the following activities during the encounter date, which includes but is not limited to: preparing to see the patient (e.g., reviewing tests and records), obtaining and/or reviewing separately obtained history, performing a medically  necessary examination or evaluation, counseling and educating the patient, family, or caregiver, ordering medications, tests, or procedures, referring and communicating with other healthcare professionals (when not reported separately), documenting clinical information in the electronic or paper health record, independently interpreting test or lab results and communicating these results to the family or caregiver, and coordinating care (when not reported separately).     Collaboration of Care: Collaboration of Care: Other reviewed notes in Epic  Patient/Guardian was advised Release of Information must be obtained prior to any record release in order to collaborate their care with an outside provider. Patient/Guardian was advised if they have not already done so to contact the registration department to sign all necessary forms in order for us  to release information regarding their care.   Consent: Patient/Guardian gives verbal consent for treatment and assignment of benefits for services provided during this visit. Patient/Guardian expressed understanding and agreed to proceed.    Todd Fossa, MD 05/16/2023, 4:46 PM

## 2023-05-16 ENCOUNTER — Encounter: Payer: Self-pay | Admitting: Psychiatry

## 2023-05-16 ENCOUNTER — Ambulatory Visit (INDEPENDENT_AMBULATORY_CARE_PROVIDER_SITE_OTHER): Payer: 59 | Admitting: Psychiatry

## 2023-05-16 VITALS — BP 126/84 | HR 97 | Temp 99.0°F | Ht 73.5 in | Wt 229.4 lb

## 2023-05-16 DIAGNOSIS — F3177 Bipolar disorder, in partial remission, most recent episode mixed: Secondary | ICD-10-CM

## 2023-05-16 DIAGNOSIS — Z79899 Other long term (current) drug therapy: Secondary | ICD-10-CM

## 2023-05-16 MED ORDER — LURASIDONE HCL 20 MG PO TABS
20.0000 mg | ORAL_TABLET | Freq: Every day | ORAL | 0 refills | Status: DC
Start: 2023-05-20 — End: 2023-08-14

## 2023-05-16 MED ORDER — LAMOTRIGINE 200 MG PO TABS: 200.0000 mg | ORAL_TABLET | Freq: Every day | ORAL | 1 refills | Status: DC

## 2023-05-16 NOTE — Patient Instructions (Signed)
 Continue lamotrigine  200 mg daily Continue Latuda  20 mg daily  Next appointment on 6/17 at 4:30

## 2023-07-09 NOTE — Progress Notes (Unsigned)
 BH MD/PA/NP OP Progress Note  07/12/2023 5:09 PM Jeffrey Hopkins  MRN:  [id number]  Chief Complaint:  Chief Complaint  Patient presents with   Follow-up   HPI:  This is a follow-up appointment for bipolar disorder.  He states that he has issues with restless leg. He has not had this symptoms before. He has initial insomnia due to this.  Although he tried melatonin, which helps for middle insomnia, does not help for initial insomnia.  He has been walking more during the day, which helps for restlessness during the day.  He states that the work has been going well.  He has not noticed any irritability, and his wife commented the same.  He believes his mood has been good.  He has good appetite.  He has been trying to work on blood pressure; he will buy a cuff machine soon.  He denies SI, HI, hallucinations.  He denies decreased need for sleep or euphoria.  He reports concern about tardive dyskinesia.  He denies any tremors, has found out about this potential side effect when he tried to look up about his restless leg.  Psychoeducation was provided that both latuda /Abilify  tends to have lower risk of tardive dyskinesia compared to the other antipsychotics, although it will be monitored over time. He agrees with the plans as outlined below.   Wt Readings from Last 3 Encounters:  07/12/23 231 lb 12.8 oz (105.1 kg)  05/16/23 229 lb 6.4 oz (104.1 kg)  01/31/23 236 lb 9.6 oz (107.3 kg)     Household:  wife  Marital status: married for more than 20 years Number of children: 2 daughters Employment: Runner, broadcasting/film/video in high school, Runner, broadcasting/film/video, teaching social studies   Substance use   Tobacco Alcohol Other substances/  Current   Vodka, one drink a few times per week   Used to drink 24 beers per week- he drank only a few the last few days as an effort to cut down. He denies physical symptoms, but has mental craving Denies, one coffee in the morning  Past   Drinks 8 beers per week denies  Past Treatment                Visit Diagnosis:    ICD-10-CM   1. Bipolar disorder, in partial remission, most recent episode mixed (HCC)  F31.77     2. Restless leg syndrome  G25.81 Ferritin    CANCELED: Ferritin      Past Psychiatric History: Please see initial evaluation for full details. I have reviewed the history. No updates at this time.     Past Medical History:  Past Medical History:  Diagnosis Date   Anxiety    Bipolar disorder (HCC)    Depression    Kidney stone     Past Surgical History:  Procedure Laterality Date   none      Family Psychiatric History: Please see initial evaluation for full details. I have reviewed the history. No updates at this time.     Family History:  Family History  Problem Relation Age of Onset   Skin cancer Mother    Anxiety disorder Father    Depression Sister    Allergies Sister    Bipolar disorder Maternal Uncle    Colon cancer Paternal Grandfather    Kidney disease Neg Hx    Prostate cancer Neg Hx     Social History:  Social History   Socioeconomic History   Marital status: Married    Spouse name:  JENNIFER   Number of children: 2   Years of education: Not on file   Highest education level: Master's degree (e.g., MA, MS, MEng, MEd, MSW, MBA)  Occupational History    Comment: FULLTIME  Tobacco Use   Smoking status: Never   Smokeless tobacco: Never  Vaping Use   Vaping status: Never Used  Substance and Sexual Activity   Alcohol use: Yes    Alcohol/week: 6.0 standard drinks of alcohol    Types: 6 Cans of beer per week   Drug use: No   Sexual activity: Yes    Birth control/protection: None  Other Topics Concern   Not on file  Social History Narrative   Not on file   Social Drivers of Health   Financial Resource Strain: Low Risk  (11/08/2022)   Received from [facility]   Overall Financial Resource Strain (CARDIA)    Difficulty of Paying Living Expenses: Not hard at all  Food Insecurity: No Food  Insecurity (11/08/2022)   Received from [facility]   Hunger Vital Sign    Within the past 12 months, you worried that your food would run out before you got the money to buy more.: Never true    Within the past 12 months, the food you bought just didn't last and you didn't have money to get more.: Never true  Transportation Needs: No Transportation Needs (11/08/2022)   Received from [facility] - Transportation    In the past 12 months, has lack of transportation kept you from medical appointments or from getting medications?: No    Lack of Transportation (Non-Medical): No  Physical Activity: Insufficiently Active (12/13/2016)   Exercise Vital Sign    Days of Exercise per Week: 3 days    Minutes of Exercise per Session: 30 min  Stress: Stress Concern Present (12/13/2016)   [employer] of Occupational Health - Occupational Stress Questionnaire    Feeling of Stress : To some extent  Social Connections: Socially Integrated (12/13/2016)   Social Connection and Isolation Panel    Frequency of Communication with Friends and Family: Three times a week    Frequency of Social Gatherings with Friends and Family: Twice a week    Attends Religious Services: More than 4 times per year    Active Member of [employer] or Organizations: Yes    Attends Engineer, structural: More than 4 times per year    Marital Status: Married    Allergies:  Allergies  Allergen Reactions   Vicodin [Hydrocodone-Acetaminophen ] Nausea And Vomiting    Metabolic Disorder Labs: No results found for: HGBA1C, MPG No results found for: PROLACTIN No results found for: CHOL, TRIG, HDL, CHOLHDL, VLDL, LDLCALC No results found for: TSH  Therapeutic Level Labs: No results found for: LITHIUM No results found for: VALPROATE No results found for: CBMZ  Current Medications: Current Outpatient Medications  Medication Sig Dispense Refill    lamoTRIgine  (LAMICTAL ) 200 MG tablet Take 1 tablet (200 mg total) by mouth daily. 90 tablet 1   lurasidone  (LATUDA ) 20 MG TABS tablet Take 1 tablet (20 mg total) by mouth daily. 90 tablet 0   tiZANidine (ZANAFLEX) 4 MG capsule Take 4 mg by mouth 3 (three) times daily as needed for muscle spasms.     vitamin B-12 (CYANOCOBALAMIN) 500 MCG tablet Take 1,000 mcg by mouth.     No current facility-administered medications for this visit.     Musculoskeletal: Strength & Muscle  Tone: within normal limits Gait & Station: normal Patient leans: N/A  Psychiatric Specialty Exam: Review of Systems  Psychiatric/Behavioral:  Positive for sleep disturbance. Negative for agitation, behavioral problems, confusion, decreased concentration, dysphoric mood, hallucinations, self-injury and suicidal ideas. The patient is not nervous/anxious and is not hyperactive.   All other systems reviewed and are negative.   Blood pressure 136/84, pulse 89, temperature 98.4 F (36.9 C), temperature source Temporal, height 6' 1.5 (1.867 m), weight 231 lb 12.8 oz (105.1 kg), SpO2 99%.Body mass index is 30.17 kg/m.  General Appearance: Well Groomed  Eye Contact:  Good  Speech:  Clear and Coherent  Volume:  Normal  Mood:  good  Affect:  Appropriate, Congruent, and Full Range  Thought Process:  Coherent  Orientation:  Full (Time, Place, and Person)  Thought Content: Logical   Suicidal Thoughts:  No  Homicidal Thoughts:  No  Memory:  Immediate;   Good  Judgement:  Good  Insight:  Good  Psychomotor Activity:  Normal, Normal tone, no rigidity, no resting/postural tremors, no tardive dyskinesia    Concentration:  Concentration: Good and Attention Span: Good  Recall:  Good  Fund of Knowledge: Good  Language: Good  Akathisia:  No  Handed:  Right  AIMS (if indicated): 0   Assets:  Communication Skills Desire for Improvement  ADL's:  Intact  Cognition: WNL  Sleep:  Poor   Screenings: GAD-7    Flowsheet Row  Office Visit from 01/28/2022 in [facility] Office Visit from 05/28/2021 in [facility]  Total GAD-7 Score 3 4   PHQ2-9    Flowsheet Row Office Visit from 01/31/2023 in [facility] Office Visit from 11/08/2022 in [facility] Office Visit from 09/06/2022 in [facility] Office Visit from 01[facility]28/2022 in [facility] Video Visit from 07/21/2021 in [facility]  PHQ-2 Total Score 0 4 0 0 0  PHQ-9 Total Score -- 16 -- -- 0   Flowsheet Row Office Visit from 01/31/2023 in [facility] Office Visit from 11/08/2022 in [facility] Office Visit from 09/06/2022 in [facility]  C-SSRS RISK CATEGORY Error: Q3, 4, or 5 should not be populated when Q2 is No Error: Q3, 4, or 5 should not be populated when Q2 is No Error: Question 1 not populated     Assessment and Plan:  Anatole Apollo is a 53 y.o. year old male with a history of bipolar disorder, hyperlipidemia vitamin B 12 deficiency,  who presents for follow up appointment for below.   1. Bipolar disorder, in partial remission, most recent episode mixed (HCC) R/o rapid cycling R/o mixed features R/o cyclothymia  Hx- Tx from Dr. Melodye Spurr. Originally on lamotrigine  200 mg daily, Abilify  2 mg daily, Ambien  10 mg at night as needed for insomnia  He reports stable mood, and denies any depressive, hypomanic symptoms, irritability since the last visit.  Will continue current dose of Latuda  to target bipolar disorder, along with lamotrigine .  Discussed potential risk of EPS, tardive dyskinesia.  Will continue to monitor QTc prolongation.   2. Restless leg syndrome Newly addressed.  He  reports restless leg, which causes insomnia.  Will obtain lab/check ferritin. May consider low dose gabapentin if it is within normal range.  Noted that he prefers to stay on Latuda  given  its effectiveness for his mood.  We plan to stay on the current medication while addressing the above issues.   # alcohol use Improving.  Will continue to assess and intervene as needed.     # high risk medication use There was significant elevation in lipid panels, and he has an upcoming appointment with Dr.Klein.  He expressed understanding of the potential adverse reactions associated with Latuda , though it was considered as a treatment option due to its relatively favorable metabolic profile.  He feels comfortable to stay on Latuda  for now considering his benefit.        Last checked  EKG HR 75, QTc 422 msec 10/2022  Lipid panels TG 438 04/2023  HbA1c Glu 104 10/2022      Plan Continue lamotrigine  200 mg daily Continue Latuda  20 mg daily  Obt[facility]ritin[facility]  Next appointment: 8/12 at 4:30, IP   Past trials-  Abilify  (limited benefit, concern of increase in appetite), ziprasidone  (insomnia), trazodone  (jittery), zolpidem  (drowsiness),     The patient demonstrates the following risk factors for suicide: Chronic risk factors for suicide include: psychiatric disorder of bipolar disorder . Acute risk factors for suicide include: N/A. Protective factors for this patient include: positive social support, responsibility to others (children, family), coping skills, and hope for the future. Considering these factors, the overall suicide risk at this point appears to be low. Patient is appropriate for outpatient follow up.     Collaboration of Care: Collaboration of Care: Other reviewed notes in Epic  Patient/Guardian was advised Release of Information must be obtained prior to any record release in order to collaborate their care with an outside provider. Patient/Guardian was advised if they have  not already done so to contact the registration department to sign all necessary forms in order for us  to release information regarding their care.   Consent: Patient/Guardian gives verbal consent for treatment and assignment of benefits for services provided during this visit. Patient/Guardian expressed understanding and agreed to proceed.    Todd Fossa, MD 07/12/2023, 5:09 PM

## 2023-07-12 ENCOUNTER — Ambulatory Visit (INDEPENDENT_AMBULATORY_CARE_PROVIDER_SITE_OTHER): Admitting: Psychiatry

## 2023-07-12 ENCOUNTER — Encounter: Payer: Self-pay | Admitting: Psychiatry

## 2023-07-12 VITALS — BP 136/84 | HR 89 | Temp 98.4°F | Ht 73.5 in | Wt 231.8 lb

## 2023-07-12 DIAGNOSIS — G2581 Restless legs syndrome: Secondary | ICD-10-CM | POA: Diagnosis not present

## 2023-07-12 DIAGNOSIS — F3177 Bipolar disorder, in partial remission, most recent episode mixed: Secondary | ICD-10-CM

## 2023-07-22 ENCOUNTER — Other Ambulatory Visit: Payer: Self-pay | Admitting: Psychiatry

## 2023-08-14 ENCOUNTER — Other Ambulatory Visit: Payer: Self-pay | Admitting: Psychiatry

## 2023-09-02 NOTE — Progress Notes (Signed)
 [facility] MD/PA/NP OP Progress Note  09/06/2023 5:10 PM Jeffrey Hopkins  MRN:  [id number]  Chief Complaint:  Chief Complaint  Patient presents with   Follow-up   HPI:  This is a follow-up appointment for bipolar disorder, restless leg.  He states that he has been doing good.  He now works for Airline pilot.  He decided to do this as it has better work schedule.  His wife now comes back home sooner, and they enjoy the time together.  He recognizes this is a totally different type of job.  He has been doing exercise religiously.  Although he used to feel anxious due to things he needed to do, he now feels anxious as he tends to have more time.  He thinks Latuda  has been helping for irritability.  He had a day of not feeling good, having some visual changes when he missed to take both of his medication.  Psychoeducation was provided regarding discontinuation symptoms.  He thinks his mood is good otherwise.  He denies feeling depressed.  He denies SI, HI, hallucinations.  He denies decreased need for sleep or euphoria.  He sleeps up to 8 hours.  He is restless and mildly improved after he started to take iron pills.  He has been taking it for 30 days, and now started to have restless leg again in the last few days.  He agrees with the plans as outlined below.   Wt Readings from Last 3 Encounters:  09/06/23 232 lb 6.4 oz (105.4 kg)  07/12/23 231 lb 12.8 oz (105.1 kg)  05/16/23 229 lb 6.4 oz (104.1 kg)     Household:  wife  Marital status: married for more than 20 years Number of children: 2 daughters Employment: Airline pilot in [employer], used to work as a Runner, broadcasting/film/video in high school, Runner, broadcasting/film/video, teaching social studies   Substance use   Tobacco Alcohol Other substances/  Current   Vodka, one drink a few times per week   Used to drink 24 beers per week- he drank only a few the last few days as an effort to cut down. He denies physical symptoms, but has mental craving Denies, one coffee in the morning   Past   Drinks 8 beers per week denies  Past Treatment               Visit Diagnosis:    ICD-10-CM   1. Bipolar disorder, in partial remission, most recent episode mixed (HCC)  F31.77     2. Restless leg syndrome  G25.81       Past Psychiatric History: Please see initial evaluation for full details. I have reviewed the history. No updates at this time.     Past Medical History:  Past Medical History:  Diagnosis Date   Anxiety    Bipolar disorder (HCC)    Depression    Kidney stone     Past Surgical History:  Procedure Laterality Date   none      Family Psychiatric History: Please see initial evaluation for full details. I have reviewed the history. No updates at this time.     Family History:  Family History  Problem Relation Age of Onset   Skin cancer Mother    Anxiety disorder Father    Depression Sister    Allergies Sister    Bipolar disorder Maternal Uncle    Colon cancer Paternal Grandfather    Kidney disease Neg Hx    Prostate cancer Neg Hx  Social History:  Social History   Socioeconomic History   Marital status: Married    Spouse name: JENNIFER   Number of children: 2   Years of education: Not on file   Highest education level: Master's degree (e.g., MA, MS, MEng, MEd, MSW, MBA)  Occupational History    Comment: FULLTIME  Tobacco Use   Smoking status: Never   Smokeless tobacco: Never  Vaping Use   Vaping status: Never Used  Substance and Sexual Activity   Alcohol use: Yes    Alcohol/week: 6.0 standard drinks of alcohol    Types: 6 Cans of beer per week   Drug use: No   Sexual activity: Yes    Birth control/protection: None  Other Topics Concern   Not on file  Social History Narrative   Not on file   Social Drivers of Health   Financial Resource Strain: Low Risk  (11/08/2022)   Received from [facility]   Overall Financial Resource Strain (CARDIA)    Difficulty of Paying Living Expenses: Not hard at all  Food  Insecurity: No Food Insecurity (11/08/2022)   Received from [facility]   Hunger Vital Sign    Within the past 12 months, you worried that your food would run out before you got the money to buy more.: Never true    Within the past 12 months, the food you bought just didn't last and you didn't have money to get more.: Never true  Transportation Needs: No Transportation Needs (11/08/2022)   Received from [facility] - Transportation    In the past 12 months, has lack of transportation kept you from medical appointments or from getting medications?: No    Lack of Transportation (Non-Medical): No  Physical Activity: Insufficiently Active (12/13/2016)   Exercise Vital Sign    Days of Exercise per Week: 3 days    Minutes of Exercise per Session: 30 min  Stress: Stress Concern Present (12/13/2016)   [employer] of Occupational Health - Occupational Stress Questionnaire    Feeling of Stress : To some extent  Social Connections: Socially Integrated (12/13/2016)   Social Connection and Isolation Panel    Frequency of Communication with Friends and Family: Three times a week    Frequency of Social Gatherings with Friends and Family: Twice a week    Attends Religious Services: More than 4 times per year    Active Member of [employer] or Organizations: Yes    Attends Engineer, structural: More than 4 times per year    Marital Status: Married    Allergies:  Allergies  Allergen Reactions   Vicodin [Hydrocodone-Acetaminophen ] Nausea And Vomiting    Metabolic Disorder Labs: No results found for: HGBA1C, MPG No results found for: PROLACTIN No results found for: CHOL, TRIG, HDL, CHOLHDL, VLDL, LDLCALC No results found for: TSH  Therapeutic Level Labs: No results found for: LITHIUM No results found for: VALPROATE No results found for: CBMZ  Current Medications: Current Outpatient Medications  Medication Sig  Dispense Refill   lamoTRIgine  (LAMICTAL ) 200 MG tablet Take 1 tablet (200 mg total) by mouth daily. 90 tablet 1   lurasidone  (LATUDA ) 20 MG TABS tablet Take 1 tablet (20 mg total) by mouth daily. 90 tablet 0   tiZANidine (ZANAFLEX) 4 MG capsule Take 4 mg by mouth 3 (three) times daily as needed for muscle spasms.     vitamin B-12 (CYANOCOBALAMIN) 500 MCG tablet Take 1,000 mcg by mouth.  No current facility-administered medications for this visit.     Musculoskeletal: Strength & Muscle Tone: within normal limits Gait & Station: normal Patient leans: N/A  Psychiatric Specialty Exam: Review of Systems  Psychiatric/Behavioral: Negative.    All other systems reviewed and are negative.   Blood pressure 124/84, pulse 76, temperature 98.8 F (37.1 C), temperature source Temporal, height 6' 1.5 (1.867 m), weight 232 lb 6.4 oz (105.4 kg), SpO2 98%.Body mass index is 30.25 kg/m.  General Appearance: Well Groomed  Eye Contact:  Good  Speech:  Clear and Coherent  Volume:  Normal  Mood:  good  Affect:  Appropriate, Congruent, and Full Range  Thought Process:  Coherent  Orientation:  Full (Time, Place, and Person)  Thought Content: Logical   Suicidal Thoughts:  No  Homicidal Thoughts:  No  Memory:  Immediate;   Good  Judgement:  Good  Insight:  Good  Psychomotor Activity:  Normal, Normal tone, no rigidity, no resting/postural tremors, no tardive dyskinesia    Concentration:  Concentration: Good and Attention Span: Good  Recall:  Good  Fund of Knowledge: Good  Language: Good  Akathisia:  No  Handed:  Right  AIMS (if indicated): 0   Assets:  Communication Skills Desire for Improvement  ADL's:  Intact  Cognition: WNL  Sleep:  Good   Screenings: GAD-7    Flowsheet Row Office Visit from 01/28/2022 in [facility] Office Visit from 05/28/2021 in [facility]  Total GAD-7 Score 3 4   PHQ2-9     Flowsheet Row Office Visit from 01/31/2023 in [facility] Office Visit from 11/08/2022 in [facility] Office Visit from 09/06/2022 in [facility] Office Visit from 01/28/2022 in [facility] Video Visit from 07/21/2021 in [facility]  PHQ-2 Total Score 0 4 0 0 0  PHQ-9 Total Score -- 16 -- -- 0   Flowsheet Row Office Visit from 01/31/2023 in [facility] Office Visit from 11/08/2022 in [facility] Office Visit from 09/06/2022 in [facility]  C-SSRS RISK CATEGORY Error: Q3, 4, or 5 should not be populated when Q2 is No Error: Q3, 4, or 5 should not be populated when Q2 is No Error: Question 1 not populated     Assessment and Plan:  Prashant Glosser is a 53 y.o. year old male with a history of bipolar disorder, hyperlipidemia vitamin B 12 deficiency,  who presents for follow up appointment for below.   1. 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 Originally on lamotrigine  200 mg daily, Abilify  2 mg daily, Ambien  10 mg at night as needed for insomnia  He denies any significant mood symptoms since the last visit, and reports good benefit from both of the medication.  Will continue current dose of Latuda  to target bipolar disorder, along with lamotrigine .  Previously discussed potential risk of metabolic side effect , EPS, tardive dyskinesia.  It is also discussed not to restart lamotrigine  if he were to miss this medication for longer than a week to avoid risk of Stevens-Johnson syndrome.  Will continue to monitor  QTc prolongation.   2. Restless leg syndrome Slightly  worsening although there was improvement when he was taking over-the-counter iron pills.  He agrees to get lab to check ferritin level.  We will likely recommend to take iron pill as it has been beneficial after reviewing the results.  May also consider gabapentin if it is within normal range.   Noted that he prefers to stay on Latuda  given its effectiveness for his mood.  Will plan to stay on the current medication while addressing the above issues.     # alcohol use Improving.  Will continue to assess and intervene as needed.      # high risk medication use There was significant elevation in lipid panels, and he has an upcoming appointment with Dr.Klein.  He expressed understanding of the potential adverse reactions associated with Latuda , though it was considered as a treatment option due to its relatively favorable metabolic profile.  He feels comfortable to stay on Latuda  for now considering his benefit.        Last checked  EKG HR 75, QTc 422 msec 10/2022  Lipid panels TG 438 04/2023  HbA1c Glu 104 10/2022      Plan Continue lamotrigine  200 mg daily Continue Latuda  20 mg daily  Obtain lab (ferritin) at [facility]  Next appointment: 10/13 at 4:30, IP   Past trials-  Abilify  (limited benefit, concern of increase in appetite), ziprasidone  (insomnia), trazodone  (jittery), zolpidem  (drowsiness),     The patient demonstrates the following risk factors for suicide: Chronic risk factors for suicide include: psychiatric disorder of bipolar disorder . Acute risk factors for suicide include: N/A. Protective factors for this patient include: positive social support, responsibility to others (children, family), coping skills, and hope for the future. Considering these factors, the overall suicide risk at this point appears to be low. Patient is appropriate for outpatient follow up.     Collaboration of Care: Collaboration of Care: Other  reviewed notes in Epic  Patient/Guardian was advised Release of Information must be obtained prior to any record release in order to collaborate their care with an outside provider. Patient/Guardian was advised if they have not already done so to contact the registration department to sign all necessary forms in order for us  to release information regarding their care.   Consent: Patient/Guardian gives verbal consent for treatment and assignment of benefits for services provided during this visit. Patient/Guardian expressed understanding and agreed to proceed.    Katheren Sleet, MD 09/06/2023, 5:10 PM

## 2023-09-06 ENCOUNTER — Encounter: Payer: Self-pay | Admitting: Psychiatry

## 2023-09-06 ENCOUNTER — Ambulatory Visit (INDEPENDENT_AMBULATORY_CARE_PROVIDER_SITE_OTHER): Admitting: Psychiatry

## 2023-09-06 VITALS — BP 124/84 | HR 76 | Temp 98.8°F | Ht 73.5 in | Wt 232.4 lb

## 2023-09-06 DIAGNOSIS — F3177 Bipolar disorder, in partial remission, most recent episode mixed: Secondary | ICD-10-CM | POA: Diagnosis not present

## 2023-09-06 DIAGNOSIS — G2581 Restless legs syndrome: Secondary | ICD-10-CM

## 2023-09-06 NOTE — Patient Instructions (Signed)
 Continue lamotrigine  200 mg daily Continue Latuda  20 mg daily  Obtain lab (ferritin) at [facility]  Next appointment: 10/13 at 4:30

## 2023-11-02 NOTE — Progress Notes (Signed)
 Virtual Visit via Video Note  I connected with Jeffrey Hopkins on 11/07/23 at  4:30 PM EDT by a video enabled telemedicine application and verified that I am speaking with the correct person using two identifiers.  Location: Patient: car Provider: office Persons participated in the visit- patient, provider    I discussed the limitations of evaluation and management by telemedicine and the availability of in person appointments. The patient expressed understanding and agreed to proceed.   I discussed the assessment and treatment plan with the patient. The patient was provided an opportunity to ask questions and all were answered. The patient agreed with the plan and demonstrated an understanding of the instructions.   The patient was advised to call back or seek an in-person evaluation if the symptoms worsen or if the condition fails to improve as anticipated.    Katheren Sleet, MD    [facility] MD/PA/NP OP Progress Note  11/07/2023 5:02 PM Jeffrey Hopkins  MRN:  [id number]  Chief Complaint:  Chief Complaint  Patient presents with   Follow-up   HPI:  This is a follow-up appointment for bipolar disorder, restless leg syndrome.  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  He likes the current work.  He is able to get back home sooner, and have a good time with his family.  His family has also commented that they have not seen him this relaxed for a long time.  His mood has been good.  His mind is not racing.  He denies irritability.  He sleeps well.  He denies feeling depressed.  He reports good appetite.  He walks 3 miles every day.  He denies decreased need for sleep or euphoria.  He denies SI, HI, hallucinations.  He has been taking iron pills and it has been helping for restless leg. He does not see the need to drink as much compared to before.  He agrees with the plans as outlined below.   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 He denies physical symptoms, but has mental craving Denies, one coffee in the morning  Past   Drinks 8 beers per week denies  Past Treatment           Visit Diagnosis:    ICD-10-CM   1. Bipolar disorder, in partial remission, most recent episode mixed (HCC)  F31.77     2. Restless leg syndrome  G25.81       Past Psychiatric History: Please see initial evaluation for full details. I have reviewed the history. No updates at this time.     Past Medical History:  Past Medical History:  Diagnosis Date   Anxiety    Bipolar disorder (HCC)    Depression    Kidney stone     Past Surgical History:  Procedure Laterality Date   none      Family Psychiatric History: Please see initial evaluation for full details. I have reviewed the history. No updates at this time.     Family History:  Family History  Problem Relation Age of  Onset   Skin cancer Mother    Anxiety disorder Father    Depression Sister    Allergies Sister    Bipolar disorder Maternal Uncle    Colon cancer Paternal Grandfather    Kidney disease Neg Hx    Prostate cancer Neg Hx     Social History:  Social History   Socioeconomic History   Marital status: Married    Spouse name: JENNIFER   Number of children: 2   Years of education: Not on file   Highest education level: Master's degree (e.g., MA, MS, MEng, MEd, MSW, MBA)  Occupational History    Comment: [employer]  Tobacco Use   Smoking status: Never   Smokeless tobacco: Never  Vaping Use   Vaping status: Never Used  Substance and Sexual Activity   Alcohol use: Yes    Alcohol/week: 6.0 standard drinks of  alcohol    Types: 6 Cans of beer per week   Drug use: No   Sexual activity: Yes    Birth control/protection: None  Other Topics Concern   Not on file  Social History Narrative   Not on file   Social Drivers of Health   Financial Resource Strain: Low Risk  (11/08/2022)   Received from [facility]   Overall Financial Resource Strain (CARDIA)    Difficulty of Paying Living Expenses: Not hard at all  Food Insecurity: No Food Insecurity (11/08/2022)   Received from [facility]   Hunger Vital Sign    Within the past 12 months, you worried that your food would run out before you got the money to buy more.: Never true    Within the past 12 months, the food you bought just didn't last and you didn't have money to get more.: Never true  Transportation Needs: No Transportation Needs (11/08/2022)   Received from [facility] - Transportation    In the past 12 months, has lack of transportation kept you from medical appointments or from getting medications?: No    Lack of Transportation (Non-Medical): No  Physical Activity: Insufficiently Active (12/13/2016)   Exercise Vital Sign    Days of Exercise per Week: 3 days    Minutes of Exercise per Session: 30 min  Stress: Stress Concern Present (12/13/2016)   [employer] of Occupational Health - Occupational Stress Questionnaire    Feeling of Stress : To some extent  Social Connections: Socially Integrated (12/13/2016)   Social Connection and Isolation Panel    Frequency of Communication with Friends and Family: Three times a week    Frequency of Social Gatherings with Friends and Family: Twice a week    Attends Religious Services: More than 4 times per year    Active Member of [employer] or Organizations: Yes    Attends Engineer, structural: More than 4 times per year    Marital Status: Married    Allergies:  Allergies  Allergen Reactions   Vicodin  [Hydrocodone-Acetaminophen ] Nausea And Vomiting    Metabolic Disorder Labs: No results found for: HGBA1C, MPG No results found for: PROLACTIN No results found for: CHOL, TRIG, HDL, CHOLHDL, VLDL, LDLCALC No results found for: TSH  Therapeutic Level Labs: No results found for: LITHIUM No results found for: VALPROATE No results found for: CBMZ  Current Medications: Current Outpatient Medications  Medication Sig Dispense Refill   [START ON 12/30/2023] lamoTRIgine  (LAMICTAL ) 200 MG tablet Take 1 tablet (200 mg total) by mouth daily. 90 tablet  1   lurasidone  (LATUDA ) 20 MG TABS tablet Take 1 tablet (20 mg total) by mouth daily. 90 tablet 0   tiZANidine (ZANAFLEX) 4 MG capsule Take 4 mg by mouth 3 (three) times daily as needed for muscle spasms.     vitamin B-12 (CYANOCOBALAMIN) 500 MCG tablet Take 1,000 mcg by mouth.     No current facility-administered medications for this visit.     Musculoskeletal: Strength & Muscle Tone: N/A Gait & Station: N/A Patient leans: N/A  Psychiatric Specialty Exam: Review of Systems  Psychiatric/Behavioral: Negative.    All other systems reviewed and are negative.   There were no vitals taken for this visit.There is no height or weight on file to calculate BMI.  General Appearance: Well Groomed  Eye Contact:  Good  Speech:  Clear and Coherent  Volume:  Normal  Mood:  good  Affect:  Appropriate, Congruent, and Full Range  Thought Process:  Coherent  Orientation:  Full (Time, Place, and Person)  Thought Content: Logical   Suicidal Thoughts:  No  Homicidal Thoughts:  No  Memory:  Immediate;   Good  Judgement:  Good  Insight:  Good  Psychomotor Activity:  Normal  Concentration:  Concentration: Good and Attention Span: Good  Recall:  Good  Fund of Knowledge: Good  Language: Good  Akathisia:  No  Handed:  Right  AIMS (if indicated): not done  Assets:  Communication Skills Desire for Improvement  ADL's:  Intact   Cognition: WNL  Sleep:  Good   Screenings: GAD-7    Flowsheet Row Office Visit from 01/28/2022 in [facility] Office Visit from 05/28/2021 in [facility]  Total GAD-7 Score 3 4   PHQ2-9    Flowsheet Row Office Visit from 01/31/2023 in [facility] Office Visit from 11/08/2022 in [facility] Office Visit from 09/06/2022 in [facility] Office Visit from 01[facility]28/2022 in [facility] Video Visit from 07/21/2021 in [facility]  PHQ-2 Total Score 0 4 0 0 0  PHQ-9 Total Score -- 16 -- -- 0   Flowsheet Row Office Visit from 01/31/2023 in [facility] Office Visit from 11/08/2022 in [facility] Office Visit from 09/06/2022 in [facility]  C-SSRS RISK CATEGORY Error: Q3, 4, or 5 should not be populated when Q2 is No Error: Q3, 4, or 5 should not be populated when Q2 is No Error: Question 1 not populated     Assessment and Plan:  Jeffrey Hopkins is a 53 y.o. year old male with a history of bipolar disorder, hyperlipidemia vitamin B 12 deficiency,  who presents for follow up appointment for below.   1. 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 Originally on lamotrigine  200 mg daily, Abilify  2 mg daily, Ambien  10 mg at night as needed for insomnia  He denies any significant mood symptoms since the last visit.  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  Will continue current dose of Latuda  to target bipolar disorder, along with lamotrigine .  Will continue to monitor any metabolic  side effect, EPS and tardive dyskinesia.   2. Restless leg syndrome Improving since taking over-the-counter iron pills.  He was advised again to recheck ferritin level for optimal level.  May consider gabapentin if any worsening despite the supplement.    # alcohol use Overall stable. Will continue to assess and intervene as needed.    # high risk medication use There was significant elevation in lipid panels, and he has an upcoming appointment with Dr.Klein.  He expressed understanding of the potential adverse reactions associated with Latuda , though it was considered as a treatment option due to its relatively favorable metabolic profile.  He feels comfortable to stay on Latuda  for now considering his benefit.        Last checked  EKG HR 75, QTc 422 msec 10/2022  Lipid panels TG 438 04/2023  HbA1c Glu 104 10/2022      Plan Continue lamotrigine  200 mg daily Continue Latuda  20 mg daily  Obtain lab (ferritin) at [place]  Next appointment: 1/5 at 4:30, IP   Past trials-  Abilify  (limited benefit, concern of increase in appetite), ziprasidone  (insomnia), trazodone  (jittery), zolpidem  (drowsiness),     The patient demonstrates the following risk factors for suicide: Chronic risk factors for suicide include: psychiatric disorder of bipolar disorder . Acute risk factors for suicide include: N/A. Protective factors for this patient include: positive social support, responsibility to others (children, family), coping skills, and hope for the future. Considering these factors, the overall suicide risk at this point appears to be low. Patient is appropriate for outpatient follow up.     Collaboration of Care: Collaboration of Care: Other reviewed notes in Epic  Patient/Guardian was advised Release of Information must be obtained prior to any record  release in order to collaborate their care with an outside provider. Patient/Guardian was advised if they have not already done so to contact the registration department to sign all necessary forms in order for us  to release information regarding their care.   Consent: Patient/Guardian gives verbal consent for treatment and assignment of benefits for services provided during this visit. Patient/Guardian expressed understanding and agreed to proceed.    Katheren Sleet, MD 11/07/2023, 5:02 PM

## 2023-11-07 ENCOUNTER — Telehealth: Admitting: Psychiatry

## 2023-11-07 ENCOUNTER — Encounter: Payer: Self-pay | Admitting: Psychiatry

## 2023-11-07 DIAGNOSIS — F3177 Bipolar disorder, in partial remission, most recent episode mixed: Secondary | ICD-10-CM | POA: Diagnosis not present

## 2023-11-07 DIAGNOSIS — G2581 Restless legs syndrome: Secondary | ICD-10-CM

## 2023-11-07 MED ORDER — LAMOTRIGINE 200 MG PO TABS: 200.0000 mg | ORAL_TABLET | Freq: Every day | ORAL | 1 refills | Status: AC

## 2023-11-07 MED ORDER — LURASIDONE HCL 20 MG PO TABS: 20.0000 mg | ORAL_TABLET | Freq: Every day | ORAL | 0 refills | Status: DC

## 2023-11-07 NOTE — Patient Instructions (Addendum)
 Continue lamotrigine  200 mg daily Continue Latuda  20 mg daily  Obtain lab (ferritin) at [place]  Next appointment: 1/5 at 4:30

## 2023-12-28 NOTE — Progress Notes (Addendum)
 History of Present Illness Jeffrey Hopkins is a 53 year old male who presents for an annual exam and follow-up of multiple chronic conditions.  He exercises daily, including 40 minutes of walking and light weight work at home. He has more control over his schedule, finishing work by 4:30 PM.  Regarding his hyperlipidemia, his diet has improved as he is eating out less, although it is not always well-balanced. His weight has remained stable, and he checks it regularly. No chest pain, trouble breathing, or palpitations.  For his B12 deficiency, he continues to supplement. There are no new symptoms reported related to this condition.  He has a history of recurrent sinusitis.  Regarding his history of renal stones, he has not experienced any symptoms and continues to maintain good hydration.  In terms of his bipolar disorder, he reports a significant improvement in his stress levels, stating 'there's just no stress' and that he is not carrying stress over from work. He continues to work with behavioral medicine regularly.  He has not had an eye exam in about two years and reports some difficulty with glare from headlights at night, which he attributes to aging. He denies any hearing issues, although he jokes that his wife might disagree.  He is attending dental appointments regularly and denies any sexual difficulties or concerns. He mentions a spot on the top of his head that he wants checked.  Current Outpatient Medications  Medication Sig Dispense Refill   cyanocobalamin (VITAMIN B12) 500 MCG tablet Take 500 mcg by mouth once daily.     lamoTRIgine  (LAMICTAL ) 200 MG tablet Take 200 mg by mouth once daily     loratadine (CLARITIN) 10 mg tablet Take 10 mg by mouth once daily.     lurasidone  (LATUDA ) 20 mg tablet Take 20 mg by mouth once daily     No current facility-administered medications for this visit.    Allergies as of 12/29/2023 - Reviewed 12/29/2023  Allergen Reaction Noted    Hydrocodone-acetaminophen  Nausea And Vomiting 02/12/2015    Past Medical History:  Diagnosis Date   Depression    Dr. Mohammed, marked improvement with lamotrigine .   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 IgG, IgM borderline low. Oncology evaluation performed (Dr. Marina, 2013) with no secondary cause detected.   Recurrent sinusitis    Vitamin B12 deficiency 07/13    Past Surgical History:  Procedure Laterality Date   COLONOSCOPY N/A 10/12/2011   Dr. SHAUNNA. Oh @ [facility] - Hyperplastic Polyp   EGD N/A 10/12/2011   Dr. SHAUNNA. Oh @ [facility] - No repeat per Dr. Ora.   Colon @ [facility]  06/09/2022   Hyperplastic polyp/Repeat 40yrs/SMR    Health Maintenance Topics with due status: Overdue     Topic Date Due   Pneumococcal Vaccine: 50+ Never done   Shingrix 01/06/2023   Annual Physical/Well Child Check 11/09/2023    Family History  Problem Relation Name Age of Onset   High blood pressure (Hypertension) Mother     High blood pressure (Hypertension) Father     Hyperthyroidism Father     Allergies Father     Celiac disease Sister     Hypothyroidism Sister     Colon cancer Paternal Grandfather      Social History   Socioeconomic History   Marital status: Married  Tobacco Use   Smoking status: Never   Smokeless tobacco: Never  Vaping Use   Vaping status: Never Used  Substance and Sexual Activity  Alcohol use: Yes    Comment: 2-3 beers per week   Drug use: No   Sexual activity: Yes    Partners: Female   Social Drivers of Corporate Investment Banker Strain: Low Risk  (12/29/2023)   Overall Financial Resource Strain (CARDIA)    Difficulty of Paying Living Expenses: Not hard at all  Food Insecurity: No Food Insecurity (12/29/2023)   Hunger Vital Sign    Worried About Running Out of Food in the Last Year: Never true    Ran Out of Food in the Last Year: Never true  Transportation Needs: No Transportation Needs (12/29/2023)   PRAPARE - Doctor, General Practice (Medical): No    Lack of Transportation (Non-Medical): No     Goals Addressed               This Visit's Progress    * Exercise  (pt-stated)   On track    * Lose Weight (pt-stated)   On track       Results for orders placed or performed in visit on 12/26/23  CBC w/auto Differential (5 Part)  Result Value Ref Range   WBC (White Blood Cell Count) 5.2 4.1 - 10.2 103/uL   RBC (Red Blood Cell Count) 4.72 4.69 - 6.13 106/uL   Hemoglobin 14.8 14.1 - 18.1 gm/dL   Hematocrit 55.7 59.9 - 52.0 %   MCV (Mean Corpuscular Volume) 93.6 80.0 - 100.0 fl   MCH (Mean Corpuscular Hemoglobin) 31.4 (H) 27.0 - 31.2 pg   MCHC (Mean Corpuscular Hemoglobin Concentration) 33.5 32.0 - 36.0 gm/dL   Platelet Count 832 849 - 450 103/uL   RDW-CV (Red Cell Distribution Width) 12.0 11.6 - 14.8 %   MPV (Mean Platelet Volume) 10.9 9.4 - 12.4 fl   Neutrophils 2.69 1.50 - 7.80 103/uL   Lymphocytes 1.98 1.00 - 3.60 103/uL   Monocytes 0.41 0.00 - 1.50 103/uL   Eosinophils 0.09 0.00 - 0.55 103/uL   Basophils 0.02 0.00 - 0.09 103/uL   Neutrophil % 51.4 32.0 - 70.0 %   Lymphocyte % 37.9 10.0 - 50.0 %   Monocyte % 7.8 4.0 - 13.0 %   Eosinophil % 1.7 1.0 - 5.0 %   Basophil% 0.4 0.0 - 2.0 %   Immature Granulocyte % 0.8 (H) <=0.7 %   Immature Granulocyte Count 0.04 <=0.06 10^3/L  Comprehensive Metabolic Panel (CMP)  Result Value Ref Range   Glucose 106 70 - 110 mg/dL   Sodium 859 863 - 854 mmol/L   Potassium 4.2 3.6 - 5.1 mmol/L   Chloride 105 97 - 109 mmol/L   Carbon Dioxide (CO2) 27.8 22.0 - 32.0 mmol/L   Urea Nitrogen (BUN) 12 7 - 25 mg/dL   Creatinine 1.0 0.7 - 1.3 mg/dL   Glomerular Filtration Rate (eGFR) 90 >60 mL/min/1.73sq m   Calcium 9.7 8.7 - 10.3 mg/dL   AST  34 8 - 39 U/L   ALT  54 6 - 57 U/L   Alk Phos (alkaline Phosphatase) 54 34 - 104 U/L   Albumin 4.7 3.5 - 4.8 g/dL   Bilirubin, Total 0.8 0.3 - 1.2 mg/dL   Protein, Total 6.5 6.1 - 7.9 g/dL   A/G Ratio 2.6  1.0 - 5.0 gm/dL  Hemoglobin J8R  Result Value Ref Range   Hemoglobin A1C 4.9 4.2 - 5.6 %   Average Blood Glucose (Calc) 94 mg/dL   Narrative   Normal Range:    4.2 - 5.6%  Increased Risk:  5.7 - 6.4% Diabetes:        >= 6.5% Glycemic Control for adults with diabetes:  <7%    Lipid Panel w/calc LDL  Result Value Ref Range   Cholesterol, Total 257 (H) 100 - 200 mg/dL   Triglyceride 696 (H) 35 - 199 mg/dL   HDL (High Density Lipoprotein) Cholesterol 66.4 29.0 - 71.0 mg/dL   LDL Calculated 869 0 - 130 mg/dL   VLDL Cholesterol 61 mg/dL   Cholesterol/HDL Ratio 3.9   Microalbumin/Creatinine Ratio, Random Urine  Result Value Ref Range   Creatinine, Random Urine 159.9 40.0 - 300.0 mg/dL   Urine Albumin, Random <7   mg/L   Urine Albumin/Creatinine Ratio <4.4 <30.0 ug/mg  Urinalysis w/Microscopic  Result Value Ref Range   Color Light Yellow Colorless, Straw, Light Yellow, Yellow, Dark Yellow   Clarity Clear Clear   Specific Gravity 1.023 1.005 - 1.030   pH, Urine 5.5 5.0 - 8.0   Protein, Urinalysis Negative Negative mg/dL   Glucose, Urinalysis Negative Negative mg/dL   Ketones, Urinalysis Negative Negative mg/dL   Blood, Urinalysis Negative Negative   Nitrite, Urinalysis Negative Negative   Leukocyte Esterase, Urinalysis Negative Negative   Bilirubin, Urinalysis Negative Negative   Urobilinogen, Urinalysis 0.2 0.2 - 1.0 mg/dL   WBC, UA 1 <=5 /hpf   Red Blood Cells, Urinalysis 0 <=3 /hpf   Bacteria, Urinalysis 0-5 0 - 5 /hpf   Squamous Epithelial Cells, Urinalysis 0 /hpf  Thyroid Stimulating-Hormone (TSH)  Result Value Ref Range   Thyroid Stimulating Hormone (TSH) 2.205 0.450-5.330 uIU/ml uIU/mL  PSA, Total (Screen)  Result Value Ref Range   PSA (Prostate Specific Antigen), Total 1.61 0.10 - 4.00 ng/mL   Narrative   Test results were determined with Beckman Coulter Hybritech Assay. Values obtained with different assay methods cannot be used interchangeably in serial testing. Assay  results should not be interpreted as absolute evidence of the presence or absence of malignant disease     BP 128/86   Pulse 75   Wt (!) 106.5 kg (234 lb 12.8 oz)   SpO2 96%   BMI 30.98 kg/m   General. Well-developed, well-nourished patient, in no distress   HEENT. Pupils equal and round; extraocular movements intact. Oropharynx moist without lesions.  TMs obscured Neck. Supple. Trachea midline.  No thyromegaly Lungs. Clear to auscultation bilaterally without wheeze or retractions. Cardiovascular.Regular rate and rhythm without murmurs, gallops, or rubs.  Carotid and radial pulses 2+.  Distal pulses 2+ Abdomen. Soft; non tender; non distended; no masses or organomegaly Genitalia: Normal male without testicular masses or tenderness Extremities.No clubbing, cyanosis, or edema Neurologic. CN grossly intact.  Motor and sensory exams intact and symmetrical.   Derm: Circular area of excoriation/scab noted over the top of the scalp, measuring approximately 1/2 x 1 cm  PE Chaperone note: A chaperone was offered for the sensitive portions of the exam but the patient/family declined  Routine general medical examination at a health care facility  (primary encounter diagnosis) Other hyperlipidemia Bipolar disorder in full remission, most recent episode unspecified type (HHS-HCC) Vitamin B12 deficiency Recurrent sinusitis Calculus of ureter Need for vaccination Depression screening Skin lesion of scalp   Assessment & Plan Other hyperlipidemia Cholesterol increased to 250s, HDL improved to 66, triglycerides increased by 50 points. No liver stress or end organ damage. Triglycerides above 350-400 may require treatment. - Reduce dietary fat intake. - Continue regular exercise. - Consider krill oil 1000 mg daily. - Recheck lipid panel  in 3 months.  Follow liver, lipids.  Increase aerobic activity  Vitamin B12 deficiency Supplementation ongoing, levels monitored. - Continue vitamin B12  supplementation. - Monitor B12 levels.  Monitor CBC  Bipolar disorder in full remission Stress improved, mood stable, follows up with behavioral medicine. - Continue current medication regimen. - Follow-up with behavioral medicine.  History of calculus of ureter No symptoms, maintains hydration. - Continue good hydration. - Monitor urinalysis and CBC.  Scalp lesion.  Dermatology consultation as area is concerning for possible squamous cell carcinoma, they discussed with him that because of the topical excoriation, this is difficult to evaluate.  General Health Maintenance Flu shot given, vaccines reviewed, eye exam due. - Administered flu shot. - Reviewed vaccines. - Schedule eye exam next year.  Follow-up yearly, returning sooner if needed   BERT JACK KLEIN III, MD  Portions of this note were created using dictation software and may contain typographical errors.   This note has been created using automated tools and reviewed for accuracy by BERT JACK KLEIN III.  Use of artificial intelligence tool (ABRIDGE) to help with documentation was discussed with patient.  *Some images could not be shown.

## 2024-01-27 NOTE — Progress Notes (Signed)
 BH MD/PA/NP OP Progress Note  01/30/2024 5:05 PM Jeffrey Hopkins  MRN:  [id number]  Chief Complaint:  Chief Complaint  Patient presents with   Follow-up   HPI:  This is a follow-up appointment for bipolar disorder, restless leg.   He states that he has been doing well.  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  He is trying to find how he spends time in the afternoon.  He reports good relationship with his family.  His wife commented that he was very engaged this year during the holiday season.  He acknowledges this, and believes it is because of the changing his work, and because of the medication.  He is aware of weight gain.  He has been eating more during holiday season, and is drinking wine more often.  He does not like winter and he has not been taking a walk.  He is willing to get back on this and eats healthier.  He denies feeling depressed or anxiety.  He denies SI, HI, hallucinations.  He has thoughts he was able to get ferritin level, and he is willing to do this.  Although he was taking iron pills, he does not have any restless leg and he has not been taking the pills anymore.  He agrees with the plans as outlined below.   Wt Readings from Last 3 Encounters:  01/30/24 238 lb (108 kg)  09/06/23 232 lb 6.4 oz (105.4 kg)  07/12/23 231 lb 12.8 oz (105.1 kg)     Household:  wife  Marital status: married for more than 20 years Number of children: 2 daughters Employment: airline pilot in [employer], used to work as a runner, broadcasting/film/video in producer, television/film/video, runner, broadcasting/film/video, teaching social studies   Substance use   Tobacco Alcohol Other substances/  Current   Vodka, one drink a few times per week   Used to drink 24 beers per week- he drank only a few the last few days as an effort to cut down. He denies physical symptoms, but has mental craving Denies, one coffee in the morning  Past   Drinks 8 beers per week denies  Past Treatment           Visit Diagnosis:    ICD-10-CM   1. Bipolar  disorder, in partial remission, most recent episode mixed (HCC)  F31.77     2. Restless leg syndrome  G25.81     3. High risk medication use  Z79.899       Past Psychiatric History: Please see initial evaluation for full details. I have reviewed the history. No updates at this time.     Past Medical History:  Past Medical History:  Diagnosis Date   Anxiety    Bipolar disorder (HCC)    Depression    Kidney stone     Past Surgical History:  Procedure Laterality Date   none      Family Psychiatric History: Please see initial evaluation for full details. I have reviewed the history. No updates at this time.     Family History:  Family History  Problem Relation Age of Onset   Skin cancer Mother    Anxiety disorder Father    Depression Sister    Allergies Sister    Bipolar disorder Maternal Uncle    Colon cancer Paternal Grandfather    Kidney disease Neg Hx    Prostate cancer Neg Hx     Social History:  Social History   Socioeconomic History   Marital status:  Married    Spouse name: JENNIFER   Number of children: 2   Years of education: Not on file   Highest education level: Master's degree (e.g., MA, MS, MEng, MEd, MSW, MBA)  Occupational History    Comment: FULLTIME  Tobacco Use   Smoking status: Never   Smokeless tobacco: Never  Vaping Use   Vaping status: Never Used  Substance and Sexual Activity   Alcohol use: Yes    Alcohol/week: 6.0 standard drinks of alcohol    Types: 6 Cans of beer per week   Drug use: No   Sexual activity: Yes    Birth control/protection: None  Other Topics Concern   Not on file  Social History Narrative   Not on file   Social Drivers of Health   Tobacco Use: Low Risk (01/30/2024)   Patient History    Smoking Tobacco Use: Never    Smokeless Tobacco Use: Never    Passive Exposure: Not on file  Financial Resource Strain: Low Risk  (12/29/2023)   [facility]   Overall Financial Resource Strain  (CARDIA)    Difficulty of Paying Living Expenses: Not hard at all  Food Insecurity: No Food Insecurity (12/29/2023)   Received from [facility]   Epic    Within the past 12 months, you worried that your food would run out before you got the money to buy more.: Never true    Within the past 12 months, the food you bought just didn't last and you didn't have money to get more.: Never true  Transportation Needs: No Transportation Needs (12/29/2023)   Received from [facility]    In the past 12 months, has lack of transportation kept you from medical appointments or from getting medications?: No    Lack of Transportation (Non-Medical): No  Physical Activity: Not on file  Stress: Not on file  Social Connections: Not on file  Depression (PHQ2-9): Low Risk (01/31/2023)   Depression (PHQ2-9)    PHQ-2 Score: 0  Recent Concern: Depression (PHQ2-9) - High Risk (11/08/2022)   Depression (PHQ2-9)    PHQ-2 Score: 16  Alcohol Screen: Not on file  Housing: Low Risk  (12/29/2023)   Received from [facility]   Epic    In the last 12 months, was there a time when you were not able to pay the mortgage or rent on time?: No    In the past 12 months, how many times have you moved where you were living?: 0    At any time in the past 12 months, were you homeless or living in a shelter (including now)?: No  Utilities: Not At Risk (12/29/2023)   Received from [facility]   Epic    In the past 12 months has the electric, gas, oil, or water company threatened to shut off services in your home?: No  Health Literacy: Not on file    Allergies: Allergies[1]  Metabolic Disorder Labs: No results found for: HGBA1C, MPG No results found for: PROLACTIN No results found for: CHOL, TRIG, HDL, CHOLHDL, VLDL, LDLCALC No results found for: TSH  Therapeutic Level Labs: No results found for: LITHIUM No  results found for: VALPROATE No results found for: CBMZ  Current Medications: Current Outpatient Medications  Medication Sig Dispense Refill   lamoTRIgine  (LAMICTAL ) 200 MG tablet Take 1 tablet (200 mg total) by mouth daily. 90 tablet 1   lurasidone  (  LATUDA ) 20 MG TABS tablet Take 1 tablet (20 mg total) by mouth daily. 90 tablet 0   vitamin B-12 (CYANOCOBALAMIN) 500 MCG tablet Take 1,000 mcg by mouth.     No current facility-administered medications for this visit.     Musculoskeletal: Strength & Muscle Tone: within normal limits Gait & Station: normal Patient leans: N/A  Psychiatric Specialty Exam: Review of Systems  Psychiatric/Behavioral:  Negative for agitation, behavioral problems, confusion, decreased concentration, dysphoric mood, hallucinations, self-injury, sleep disturbance and suicidal ideas. The patient is not nervous/anxious and is not hyperactive.   All other systems reviewed and are negative.   Blood pressure 134/74, pulse 75, temperature 97.8 F (36.6 C), temperature source Temporal, height 6' 2 (1.88 m), weight 238 lb (108 kg).Body mass index is 30.56 kg/m.  General Appearance: Well Groomed  Eye Contact:  Good  Speech:  Clear and Coherent  Volume:  Normal  Mood:  good  Affect:  Appropriate, Congruent, and Full Range  Thought Process:  Coherent  Orientation:  Full (Time, Place, and Person)  Thought Content: Logical   Suicidal Thoughts:  No  Homicidal Thoughts:  No  Memory:  Immediate;   Good  Judgement:  Good  Insight:  Good  Psychomotor Activity:  Normal, Normal tone, no rigidity, no resting/postural tremors, no tardive dyskinesia    Concentration:  Concentration: Good and Attention Span: Good  Recall:  Good  Fund of Knowledge: Good  Language: Good  Akathisia:  No  Handed:  Right  AIMS (if indicated): 0   Assets:  Communication Skills Desire for Improvement  ADL's:  Intact  Cognition: WNL  Sleep:  Good   Screenings: GAD-7    Flowsheet  Row Office Visit from 01/28/2022 in [facility] Office Visit from 05/28/2021 in [facility]  Total GAD-7 Score 3 4   PHQ2-9    Flowsheet Row Office Visit from 01/31/2023 in [facility] Office Visit from 11/08/2022 in [facility] Office Visit from 09/06/2022 in [facility] Office Visit from 01/28/2022 in [facility] Video Visit from 07/21/2021 in [facility]  PHQ-2 Total Score 0 4 0 0 0  PHQ-9 Total Score -- 16 -- -- 0   Flowsheet Row Office Visit from 01/31/2023 in [facility] Office Visit from 11/08/2022 in [facility] Office Visit from 09/06/2022 in [facility]  C-SSRS RISK CATEGORY Error: Q3, 4, or 5 should not be populated when Q2 is No Error: Q3, 4, or 5 should not be populated when Q2 is No Error: Question 1 not populated     Assessment and Plan:  Jeffrey Hopkins is a 54 year old male with a history of bipolar disorder, hyperlipidemia vitamin B 12 deficiency,  who presents for follow up appointment for below.   1. 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 Mohammed. Originally on lamotrigine  200 mg daily, Abilify  2 mg daily, Ambien  10 mg at night as needed for insomnia  He denies any significant mood symptoms since the last visit.  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  Will continue current dose of Latuda  to target bipolar disorder, along with  lamotrigine  for bipolar depression.   2. Restless leg  syndrome Improving after taking over-the-counter iron pills.  He agrees to check ferritin level to assess his baseline.   3. High risk medication use He is aware of metabolic side effect, QTc prolongation from Latuda .  He has weight gain, which she attributes to lack of exercise, diet and drinking.  He is willing to work on these.  Will continue the same dose of Latuda  given its benefit.        Last checked  EKG HR 75, QTc 422 msec 10/2022  Lipid panels TG 303 H, Chol 257 12/2023  HbA1c 4.9 12/2023      Plan Continue lamotrigine  200 mg daily Continue Latuda  20 mg daily  Obtain lab (ferritin) at [facility]  Next appointment: 4/7 at 4:30, IP   Past trials-  Abilify  (limited benefit, concern of increase in appetite), ziprasidone  (insomnia), trazodone  (jittery), zolpidem  (drowsiness),     The patient demonstrates the following risk factors for suicide: Chronic risk factors for suicide include: psychiatric disorder of bipolar disorder . Acute risk factors for suicide include: N/A. Protective factors for this patient include: positive social support, responsibility to others (children, family), coping skills, and hope for the future. Considering these factors, the overall suicide risk at this point appears to be low. Patient is appropriate for outpatient follow up.   A total of 30 minutes was spent on the following activities during the encounter date, which includes but is not limited to: preparing to see the patient (e.g., reviewing tests and records), obtaining and/or reviewing separately obtained history, performing a medically necessary examination or evaluation, counseling and educating the patient, family, or caregiver, ordering medications, tests, or procedures, referring and communicating with other healthcare professionals (when not reported separately), documenting clinical information in the electronic or paper health record, independently interpreting test or lab results and communicating  these results to the family or caregiver, and coordinating care (when not reported separately).     Collaboration of Care: Collaboration of Care: Other reviewed notes in Epic  Patient/Guardian was advised Release of Information must be obtained prior to any record release in order to collaborate their care with an outside provider. Patient/Guardian was advised if they have not already done so to contact the registration department to sign all necessary forms in order for us  to release information regarding their care.   Consent: Patient/Guardian gives verbal consent for treatment and assignment of benefits for services provided during this visit. Patient/Guardian expressed understanding and agreed to proceed.    Katheren Sleet, MD 01/30/2024, 5:05 PM      [1]  Allergies Allergen Reactions   Vicodin [Hydrocodone-Acetaminophen ] Nausea And Vomiting

## 2024-01-30 ENCOUNTER — Encounter: Payer: Self-pay | Admitting: Psychiatry

## 2024-01-30 ENCOUNTER — Other Ambulatory Visit: Payer: Self-pay

## 2024-01-30 ENCOUNTER — Ambulatory Visit (INDEPENDENT_AMBULATORY_CARE_PROVIDER_SITE_OTHER): Admitting: Psychiatry

## 2024-01-30 VITALS — BP 134/74 | HR 75 | Temp 97.8°F | Ht 74.0 in | Wt 238.0 lb

## 2024-01-30 DIAGNOSIS — Z79899 Other long term (current) drug therapy: Secondary | ICD-10-CM | POA: Diagnosis not present

## 2024-01-30 DIAGNOSIS — F3177 Bipolar disorder, in partial remission, most recent episode mixed: Secondary | ICD-10-CM

## 2024-01-30 DIAGNOSIS — G2581 Restless legs syndrome: Secondary | ICD-10-CM | POA: Diagnosis not present

## 2024-01-30 NOTE — Patient Instructions (Signed)
 Continue lamotrigine  200 mg daily Continue Latuda  20 mg daily  Obtain lab (ferritin) at [facility]  Next appointment: 4/7 at 4:30

## 2024-02-06 ENCOUNTER — Other Ambulatory Visit: Payer: Self-pay | Admitting: Psychiatry

## 2024-02-06 ENCOUNTER — Ambulatory Visit: Payer: Self-pay | Admitting: Dermatology

## 2024-05-01 ENCOUNTER — Ambulatory Visit: Admitting: Psychiatry
